# Patient Record
Sex: Female | Born: 1985 | Hispanic: No | Marital: Married | Smoking: Never smoker
Health system: Southern US, Community
[De-identification: ages and names within clinical notes are randomized; demographics above are authoritative.]

## PROBLEM LIST (undated history)

## (undated) DIAGNOSIS — J40 Bronchitis, not specified as acute or chronic: Secondary | ICD-10-CM

## (undated) DIAGNOSIS — R519 Headache, unspecified: Secondary | ICD-10-CM

## (undated) DIAGNOSIS — D561 Beta thalassemia: Secondary | ICD-10-CM

## (undated) DIAGNOSIS — K760 Fatty (change of) liver, not elsewhere classified: Secondary | ICD-10-CM

## (undated) DIAGNOSIS — R51 Headache: Secondary | ICD-10-CM

## (undated) DIAGNOSIS — Z789 Other specified health status: Secondary | ICD-10-CM

## (undated) DIAGNOSIS — I10 Essential (primary) hypertension: Secondary | ICD-10-CM

## (undated) HISTORY — PX: NO PAST SURGERIES: SHX2092

## (undated) HISTORY — DX: Fatty (change of) liver, not elsewhere classified: K76.0

## (undated) HISTORY — DX: Beta thalassemia: D56.1

## (undated) HISTORY — PX: OTHER SURGICAL HISTORY: SHX169

---

## 2010-03-01 ENCOUNTER — Inpatient Hospital Stay (HOSPITAL_COMMUNITY): Admission: AD | Admit: 2010-03-01 | Discharge: 2010-03-04 | Payer: Self-pay | Admitting: Obstetrics & Gynecology

## 2010-04-12 ENCOUNTER — Encounter: Admission: RE | Admit: 2010-04-12 | Discharge: 2010-04-12 | Payer: Self-pay | Admitting: Internal Medicine

## 2011-03-03 LAB — ABO/RH: RH Type: POSITIVE

## 2011-03-13 ENCOUNTER — Emergency Department (HOSPITAL_COMMUNITY)
Admission: EM | Admit: 2011-03-13 | Discharge: 2011-03-14 | Disposition: A | Discharge status: Home / Self Care | Payer: BC Managed Care – PPO | Attending: Emergency Medicine | Admitting: Emergency Medicine

## 2011-03-13 DIAGNOSIS — W268XXA Contact with other sharp object(s), not elsewhere classified, initial encounter: Secondary | ICD-10-CM | POA: Insufficient documentation

## 2011-03-13 DIAGNOSIS — Y93G1 Activity, food preparation and clean up: Secondary | ICD-10-CM | POA: Insufficient documentation

## 2011-03-13 DIAGNOSIS — Y92009 Unspecified place in unspecified non-institutional (private) residence as the place of occurrence of the external cause: Secondary | ICD-10-CM | POA: Insufficient documentation

## 2011-03-13 DIAGNOSIS — S51809A Unspecified open wound of unspecified forearm, initial encounter: Secondary | ICD-10-CM | POA: Insufficient documentation

## 2011-03-21 LAB — CBC
HCT: 18.4 % — ABNORMAL LOW (ref 36.0–46.0)
HCT: 19 % — ABNORMAL LOW (ref 36.0–46.0)
HCT: 30.2 % — ABNORMAL LOW (ref 36.0–46.0)
HCT: 37 % (ref 36.0–46.0)
Hemoglobin: 11.6 g/dL — ABNORMAL LOW (ref 12.0–15.0)
Hemoglobin: 5.7 g/dL — CL (ref 12.0–15.0)
Hemoglobin: 5.9 g/dL — CL (ref 12.0–15.0)
Hemoglobin: 7 g/dL — ABNORMAL LOW (ref 12.0–15.0)
Hemoglobin: 9.6 g/dL — ABNORMAL LOW (ref 12.0–15.0)
MCHC: 31 g/dL (ref 30.0–36.0)
MCHC: 31.4 g/dL (ref 30.0–36.0)
MCV: 69.6 fL — ABNORMAL LOW (ref 78.0–100.0)
MCV: 69.7 fL — ABNORMAL LOW (ref 78.0–100.0)
MCV: 71.7 fL — ABNORMAL LOW (ref 78.0–100.0)
MCV: 72.9 fL — ABNORMAL LOW (ref 78.0–100.0)
Platelets: 130 10*3/uL — ABNORMAL LOW (ref 150–400)
RDW: 15.3 % (ref 11.5–15.5)
RDW: 16.3 % — ABNORMAL HIGH (ref 11.5–15.5)
WBC: 10.5 10*3/uL (ref 4.0–10.5)
WBC: 12 10*3/uL — ABNORMAL HIGH (ref 4.0–10.5)
WBC: 12.8 10*3/uL — ABNORMAL HIGH (ref 4.0–10.5)

## 2011-03-21 LAB — RPR: RPR Ser Ql: NONREACTIVE

## 2011-03-21 LAB — CROSSMATCH: Antibody Screen: NEGATIVE

## 2011-03-29 ENCOUNTER — Inpatient Hospital Stay (INDEPENDENT_AMBULATORY_CARE_PROVIDER_SITE_OTHER)
Admission: RE | Admit: 2011-03-29 | Discharge: 2011-03-29 | Disposition: A | Discharge status: Home / Self Care | Payer: BC Managed Care – PPO | Source: Ambulatory Visit | Attending: Family Medicine | Admitting: Family Medicine

## 2011-03-29 DIAGNOSIS — Z4802 Encounter for removal of sutures: Secondary | ICD-10-CM

## 2011-08-11 LAB — STREP B DNA PROBE: GBS: NEGATIVE

## 2011-08-12 ENCOUNTER — Inpatient Hospital Stay (HOSPITAL_COMMUNITY)
Admission: AD | Admit: 2011-08-12 | Discharge: 2011-08-12 | Disposition: A | Discharge status: Home / Self Care | Payer: BC Managed Care – PPO | Source: Ambulatory Visit | Attending: Obstetrics and Gynecology | Admitting: Obstetrics and Gynecology

## 2011-08-12 ENCOUNTER — Encounter (HOSPITAL_COMMUNITY): Payer: Self-pay

## 2011-08-12 ENCOUNTER — Ambulatory Visit (HOSPITAL_COMMUNITY)
Admission: RE | Admit: 2011-08-12 | Discharge: 2011-08-12 | Disposition: A | Discharge status: Home / Self Care | Payer: BC Managed Care – PPO | Source: Ambulatory Visit | Attending: Obstetrics and Gynecology | Admitting: Obstetrics and Gynecology

## 2011-08-12 ENCOUNTER — Other Ambulatory Visit (HOSPITAL_COMMUNITY): Payer: Self-pay | Admitting: Obstetrics and Gynecology

## 2011-08-12 DIAGNOSIS — O368190 Decreased fetal movements, unspecified trimester, not applicable or unspecified: Secondary | ICD-10-CM

## 2011-08-12 DIAGNOSIS — Z3689 Encounter for other specified antenatal screening: Secondary | ICD-10-CM | POA: Insufficient documentation

## 2011-08-12 NOTE — ED Provider Notes (Addendum)
History   Pt presents today for NST secondary to decreased fetal movement. She had a BPP earlier with a score of 6/8. She denies any other problems at this time.  Chief Complaint  Patient presents with  . Decreased Fetal Movement   HPI  OB History    Grav Para Term Preterm Abortions TAB SAB Ect Mult Living   2 1 1       1       No past medical history on file.  No past surgical history on file.  No family history on file.  History  Substance Use Topics  . Smoking status: Not on file  . Smokeless tobacco: Not on file  . Alcohol Use: Not on file    Allergies: No Known Allergies  Prescriptions prior to admission  Medication Sig Dispense Refill  . Iron Combinations (IRON COMPLEX PO) Take 1 tablet by mouth daily.        . prenatal vitamin w/FE, FA (PRENATAL 1 + 1) 27-1 MG TABS Take 1 tablet by mouth daily.          Review of Systems  Constitutional: Negative for fever and chills.  Gastrointestinal: Negative for nausea, vomiting, abdominal pain, diarrhea and constipation.  Genitourinary: Negative for dysuria, urgency, frequency and hematuria.  Neurological: Negative for dizziness and headaches.  Psychiatric/Behavioral: Negative for depression and suicidal ideas.   Physical Exam   Blood pressure 102/57, pulse 111, temperature 98.4 F (36.9 C), temperature source Oral, resp. rate 18.  Physical Exam  Constitutional: She is oriented to person, place, and time. She appears well-developed and well-nourished. No distress.  HENT:  Head: Normocephalic and atraumatic.  Eyes: EOM are normal. Pupils are equal, round, and reactive to light.  GI: Soft. She exhibits no distension. There is no tenderness. There is no rebound and no guarding.  Genitourinary:       Cervix unchanged from earlier exam today in office. 1-2/thick/posterior.  Neurological: She is alert and oriented to person, place, and time.  Skin: Skin is warm and dry. She is not diaphoretic.  Psychiatric: She has a  normal mood and affect. Her behavior is normal. Judgment and thought content normal.    MAU Course  Procedures  NST reactive for a total BPP of 8/10.  Discussed pt with Dr. Henderson Cloud at length.  Assessment and Plan  Decreased fetal movement: pt has f/u scheduled. Discussed diet, activity, risks, and precautions.   Clinton Gallant. Nohealani Medinger III, DrHSc, MPAS, PA-C  08/12/2011, 7:36 PM

## 2011-08-12 NOTE — Progress Notes (Signed)
Patient was sent for BPP for decreased fetal movement from MD office, BPP 6/8, received order for NST.

## 2011-08-13 ENCOUNTER — Inpatient Hospital Stay (HOSPITAL_COMMUNITY)
Admission: AD | Admit: 2011-08-13 | Discharge: 2011-08-13 | Disposition: A | Discharge status: Home / Self Care | Payer: BC Managed Care – PPO | Source: Ambulatory Visit | Attending: Obstetrics and Gynecology | Admitting: Obstetrics and Gynecology

## 2011-08-13 ENCOUNTER — Encounter (HOSPITAL_COMMUNITY): Payer: Self-pay | Admitting: *Deleted

## 2011-08-13 DIAGNOSIS — O479 False labor, unspecified: Secondary | ICD-10-CM | POA: Insufficient documentation

## 2011-08-13 HISTORY — DX: Other specified health status: Z78.9

## 2011-08-13 NOTE — Progress Notes (Signed)
Contractions every 2 to 3 minutes for past two hours.

## 2011-08-13 NOTE — Progress Notes (Signed)
Pt states, " My contractions are now every 3 min, and they are stronger. I was 2.5 cm yesterday."

## 2011-08-13 NOTE — ED Notes (Signed)
Pl;an of care discussed with pt and spouse. Ambien offered, pt deferred and states, no problems with sleep.

## 2011-08-20 ENCOUNTER — Encounter (HOSPITAL_COMMUNITY): Payer: Self-pay | Admitting: Anesthesiology

## 2011-08-20 ENCOUNTER — Inpatient Hospital Stay (HOSPITAL_COMMUNITY)
Admission: AD | Admit: 2011-08-20 | Discharge: 2011-08-22 | DRG: 373 | Disposition: A | Discharge status: Home / Self Care | Payer: BC Managed Care – PPO | Source: Ambulatory Visit | Attending: Obstetrics & Gynecology | Admitting: Obstetrics & Gynecology

## 2011-08-20 ENCOUNTER — Inpatient Hospital Stay (HOSPITAL_COMMUNITY): Payer: BC Managed Care – PPO | Admitting: Anesthesiology

## 2011-08-20 ENCOUNTER — Encounter (HOSPITAL_COMMUNITY): Payer: Self-pay | Admitting: *Deleted

## 2011-08-20 LAB — CBC
Hemoglobin: 9.7 g/dL — ABNORMAL LOW (ref 12.0–15.0)
MCHC: 32 g/dL (ref 30.0–36.0)
RDW: 15.8 % — ABNORMAL HIGH (ref 11.5–15.5)

## 2011-08-20 MED ORDER — PRENATAL PLUS 27-1 MG PO TABS
1.0000 | ORAL_TABLET | Freq: Every day | ORAL | Status: DC
  Administered 2011-08-21 – 2011-08-22 (×2): 1 via ORAL
  Filled 2011-08-20 (×2): qty 1

## 2011-08-20 MED ORDER — LANOLIN HYDROUS EX OINT: TOPICAL_OINTMENT | CUTANEOUS | Status: DC | PRN

## 2011-08-20 MED ORDER — LACTATED RINGERS IV SOLN
500.0000 mL | Freq: Once | INTRAVENOUS | Status: AC
  Administered 2011-08-20: 500 mL via INTRAVENOUS

## 2011-08-20 MED ORDER — PHENYLEPHRINE 40 MCG/ML (10ML) SYRINGE FOR IV PUSH (FOR BLOOD PRESSURE SUPPORT)
80.0000 ug | PREFILLED_SYRINGE | INTRAVENOUS | Status: DC | PRN
  Filled 2011-08-20: qty 5

## 2011-08-20 MED ORDER — IBUPROFEN 600 MG PO TABS
600.0000 mg | ORAL_TABLET | Freq: Four times a day (QID) | ORAL | Status: DC | PRN
  Administered 2011-08-20: 600 mg via ORAL
  Filled 2011-08-20 (×2): qty 1

## 2011-08-20 MED ORDER — FLEET ENEMA 7-19 GM/118ML RE ENEM: 1.0000 | ENEMA | RECTAL | Status: DC | PRN

## 2011-08-20 MED ORDER — EPHEDRINE 5 MG/ML INJ
10.0000 mg | INTRAVENOUS | Status: DC | PRN
  Filled 2011-08-20 (×2): qty 4

## 2011-08-20 MED ORDER — LIDOCAINE HCL 1.5 % IJ SOLN
INTRAMUSCULAR | Status: DC | PRN
  Administered 2011-08-20: 2 mL via EPIDURAL
  Administered 2011-08-20 (×2): 5 mL via EPIDURAL

## 2011-08-20 MED ORDER — PHENYLEPHRINE 40 MCG/ML (10ML) SYRINGE FOR IV PUSH (FOR BLOOD PRESSURE SUPPORT)
80.0000 ug | PREFILLED_SYRINGE | INTRAVENOUS | Status: DC | PRN
  Filled 2011-08-20 (×2): qty 5

## 2011-08-20 MED ORDER — OXYCODONE-ACETAMINOPHEN 5-325 MG PO TABS: 1.0000 | ORAL_TABLET | ORAL | Status: DC | PRN

## 2011-08-20 MED ORDER — LIDOCAINE HCL (PF) 1 % IJ SOLN
30.0000 mL | INTRAMUSCULAR | Status: DC | PRN
  Filled 2011-08-20 (×2): qty 30

## 2011-08-20 MED ORDER — FENTANYL 2.5 MCG/ML BUPIVACAINE 1/10 % EPIDURAL INFUSION (WH - ANES)
14.0000 mL/h | INTRAMUSCULAR | Status: DC
  Administered 2011-08-20 (×2): 14 mL/h via EPIDURAL
  Filled 2011-08-20 (×2): qty 60

## 2011-08-20 MED ORDER — LACTATED RINGERS IV SOLN
500.0000 mL | INTRAVENOUS | Status: DC | PRN
  Administered 2011-08-20: 1000 mL via INTRAVENOUS

## 2011-08-20 MED ORDER — OXYTOCIN BOLUS FROM INFUSION
500.0000 mL | Freq: Once | INTRAVENOUS | Status: DC
  Filled 2011-08-20: qty 500

## 2011-08-20 MED ORDER — OXYTOCIN 20 UNITS IN LACTATED RINGERS INFUSION - SIMPLE: 125.0000 mL/h | INTRAVENOUS | Status: AC

## 2011-08-20 MED ORDER — BENZOCAINE-MENTHOL 20-0.5 % EX AERO
1.0000 "application " | INHALATION_SPRAY | CUTANEOUS | Status: DC | PRN
  Administered 2011-08-21: 1 via TOPICAL

## 2011-08-20 MED ORDER — ONDANSETRON HCL 4 MG PO TABS: 4.0000 mg | ORAL_TABLET | ORAL | Status: DC | PRN

## 2011-08-20 MED ORDER — WITCH HAZEL-GLYCERIN EX PADS: 1.0000 "application " | MEDICATED_PAD | CUTANEOUS | Status: DC | PRN

## 2011-08-20 MED ORDER — DIPHENHYDRAMINE HCL 50 MG/ML IJ SOLN: 12.5000 mg | INTRAMUSCULAR | Status: DC | PRN

## 2011-08-20 MED ORDER — IBUPROFEN 600 MG PO TABS
600.0000 mg | ORAL_TABLET | Freq: Four times a day (QID) | ORAL | Status: DC
  Administered 2011-08-21 – 2011-08-22 (×5): 600 mg via ORAL
  Filled 2011-08-20 (×4): qty 1

## 2011-08-20 MED ORDER — SIMETHICONE 80 MG PO CHEW: 80.0000 mg | CHEWABLE_TABLET | ORAL | Status: DC | PRN

## 2011-08-20 MED ORDER — BUTORPHANOL TARTRATE 2 MG/ML IJ SOLN: 1.0000 mg | INTRAMUSCULAR | Status: DC | PRN

## 2011-08-20 MED ORDER — SENNOSIDES-DOCUSATE SODIUM 8.6-50 MG PO TABS
2.0000 | ORAL_TABLET | Freq: Every day | ORAL | Status: DC
  Administered 2011-08-21: 2 via ORAL

## 2011-08-20 MED ORDER — CITRIC ACID-SODIUM CITRATE 334-500 MG/5ML PO SOLN: 30.0000 mL | ORAL | Status: DC | PRN

## 2011-08-20 MED ORDER — ONDANSETRON HCL 4 MG/2ML IJ SOLN: 4.0000 mg | Freq: Four times a day (QID) | INTRAMUSCULAR | Status: DC | PRN

## 2011-08-20 MED ORDER — TERBUTALINE SULFATE 1 MG/ML IJ SOLN: 0.2500 mg | Freq: Once | INTRAMUSCULAR | Status: DC | PRN

## 2011-08-20 MED ORDER — OXYTOCIN 20 UNITS IN LACTATED RINGERS INFUSION - SIMPLE
1.0000 m[IU]/min | INTRAVENOUS | Status: DC
  Administered 2011-08-20: 1 m[IU]/min via INTRAVENOUS
  Filled 2011-08-20: qty 1000

## 2011-08-20 MED ORDER — ACETAMINOPHEN 325 MG PO TABS: 650.0000 mg | ORAL_TABLET | ORAL | Status: DC | PRN

## 2011-08-20 MED ORDER — TETANUS-DIPHTH-ACELL PERTUSSIS 5-2.5-18.5 LF-MCG/0.5 IM SUSP: 0.5000 mL | Freq: Once | INTRAMUSCULAR | Status: DC

## 2011-08-20 MED ORDER — OXYCODONE-ACETAMINOPHEN 5-325 MG PO TABS: 2.0000 | ORAL_TABLET | ORAL | Status: DC | PRN

## 2011-08-20 MED ORDER — DIBUCAINE 1 % RE OINT: 1.0000 "application " | TOPICAL_OINTMENT | RECTAL | Status: DC | PRN

## 2011-08-20 MED ORDER — ZOLPIDEM TARTRATE 5 MG PO TABS: 5.0000 mg | ORAL_TABLET | Freq: Every evening | ORAL | Status: DC | PRN

## 2011-08-20 MED ORDER — METHYLERGONOVINE MALEATE 0.2 MG/ML IJ SOLN
INTRAMUSCULAR | Status: AC
  Administered 2011-08-20: 0.2 mg via INTRAMUSCULAR
  Filled 2011-08-20: qty 1

## 2011-08-20 MED ORDER — ONDANSETRON HCL 4 MG/2ML IJ SOLN: 4.0000 mg | INTRAMUSCULAR | Status: DC | PRN

## 2011-08-20 MED ORDER — EPHEDRINE 5 MG/ML INJ
10.0000 mg | INTRAVENOUS | Status: DC | PRN
  Filled 2011-08-20: qty 4

## 2011-08-20 MED ORDER — DIPHENHYDRAMINE HCL 25 MG PO CAPS: 25.0000 mg | ORAL_CAPSULE | Freq: Four times a day (QID) | ORAL | Status: DC | PRN

## 2011-08-20 MED ORDER — LACTATED RINGERS IV SOLN
INTRAVENOUS | Status: DC
  Administered 2011-08-20 (×2): via INTRAVENOUS

## 2011-08-20 NOTE — Progress Notes (Signed)
1850 dr. Aldona Bar notified that the pt is 8cm, -1,0 station and feeling some pressure.

## 2011-08-20 NOTE — Progress Notes (Signed)
corrction to previous note. Dr. Aldona Bar notified at 1855 that pt is 8cm, -1, 0 station.

## 2011-08-20 NOTE — Progress Notes (Signed)
Patient reports onset of watery discharge with blood mixed in, back pain

## 2011-08-20 NOTE — Anesthesia Preprocedure Evaluation (Signed)
Anesthesia Evaluation  Name, MR# and DOB Patient awake  General Assessment Comment  Reviewed: Allergy & Precautions, H&P , NPO status , Patient's Chart, lab work & pertinent test results, reviewed documented beta blocker date and time   History of Anesthesia Complications Negative for: history of anesthetic complications  Airway Mallampati: II TM Distance: >3 FB Neck ROM: full    Dental  (+) Teeth Intact   Pulmonary  clear to auscultation  breath sounds clear to auscultation none    Cardiovascular regular Normal    Neuro/Psych Negative Neurological ROS  Negative Psych ROS  GI/Hepatic/Renal negative GI ROS  negative Liver ROS  negative Renal ROS        Endo/Other  Negative Endocrine ROS (+)      Abdominal   Musculoskeletal   Hematology negative hematology ROS (+)   Peds  Reproductive/Obstetrics (+) Pregnancy    Anesthesia Other Findings             Anesthesia Physical Anesthesia Plan  ASA: II  Anesthesia Plan: Epidural   Post-op Pain Management:    Induction:   Airway Management Planned:   Additional Equipment:   Intra-op Plan:   Post-operative Plan:   Informed Consent: I have reviewed the patients History and Physical, chart, labs and discussed the procedure including the risks, benefits and alternatives for the proposed anesthesia with the patient or authorized representative who has indicated his/her understanding and acceptance.     Plan Discussed with:   Anesthesia Plan Comments:         Anesthesia Quick Evaluation  

## 2011-08-20 NOTE — Progress Notes (Signed)
Progressed well and had NSD of viable 8 pound 8 oz. Female, 9/9 Apgars over 2nd degree tear.  PDI--grossly normal, 2A/1V.  Tear repaired.  Breast feed.  Does desire circ.

## 2011-08-20 NOTE — Anesthesia Procedure Notes (Signed)
Epidural Patient location during procedure: OB Start time: 08/20/2011 2:39 PM Reason for block: procedure for pain  Staffing Performed by: anesthesiologist   Preanesthetic Checklist Completed: patient identified, site marked, surgical consent, pre-op evaluation, timeout performed, IV checked, risks and benefits discussed and monitors and equipment checked  Epidural Patient position: sitting Prep: site prepped and draped and DuraPrep Patient monitoring: continuous pulse ox and blood pressure Approach: midline Injection technique: LOR air  Needle:  Needle type: Tuohy  Needle gauge: 17 G Needle length: 9 cm Needle insertion depth: 5 cm cm Catheter type: closed end flexible Catheter size: 19 Gauge Catheter at skin depth: 10 cm Test dose: negative  Assessment Events: blood not aspirated, injection not painful, no injection resistance, negative IV test and no paresthesia

## 2011-08-20 NOTE — Progress Notes (Signed)
Cx slightly changed -- now 3-4.  AROM clear fluid.  Baby reactive.  IUPC placed and will increase rate of pitocin increase.

## 2011-08-20 NOTE — H&P (Addendum)
  G2P1 at 39+ weeks with mild contractions and increased show and ? Of ROM.Marland Kitchen Preg benign.  -GBS.  Seen by me yesterday in office and then was 2/60/vtx -2.  Concern about having another big baby.  !st preg delivered in 3/11 resulted in 9 pound baby with 3rd degree tears and a lot of blood loss.  VS stable.  FH reactive.  ? Some contractions.  EFW 8 pounds CX 3/60-70/vtx -2 and membranes felt.  Imp:  39+ weeks Probable early labor (cervical change since yesterday)  Plan:  Admit.  Discussed pitocin.  Expectant management.

## 2011-08-21 LAB — CBC
Hemoglobin: 8.4 g/dL — ABNORMAL LOW (ref 12.0–15.0)
Platelets: 183 10*3/uL (ref 150–400)
RBC: 4.26 MIL/uL (ref 3.87–5.11)
WBC: 9.3 10*3/uL (ref 4.0–10.5)

## 2011-08-21 MED ORDER — METHYLERGONOVINE MALEATE 0.2 MG/ML IJ SOLN
0.2000 mg | Freq: Once | INTRAMUSCULAR | Status: AC
  Administered 2011-08-21: 0.2 mg via INTRAMUSCULAR
  Filled 2011-08-21: qty 1

## 2011-08-21 MED ORDER — BENZOCAINE-MENTHOL 20-0.5 % EX AERO
INHALATION_SPRAY | CUTANEOUS | Status: AC
  Administered 2011-08-21: 1 via TOPICAL
  Filled 2011-08-21: qty 56

## 2011-08-21 NOTE — Progress Notes (Signed)
Doing Great!  Breast.  CBC 8.4/9.3  Platelets 183K.  Hgb was 9.7 pre delivery.  Baby to be circ'ed today if OK'ed by Peds. Likely D/C AM 8/27.

## 2011-08-21 NOTE — Anesthesia Postprocedure Evaluation (Signed)
  Anesthesia Post-op Note  Patient: Danielle Simmons  Procedure(s) Performed: * No procedures listed *  Patient Location: PACU and Mother/Baby  Anesthesia Type: Epidural  Level of Consciousness: awake, alert  and oriented  Airway and Oxygen Therapy: Patient Spontanous Breathing  Post-op Pain: none  Post-op Assessment: Patient's Cardiovascular Status Stable, Respiratory Function Stable, Patent Airway, No signs of Nausea or vomiting and Pain level controlled  Post-op Vital Signs: stable  Complications: No apparent anesthesia complications

## 2011-08-22 NOTE — Progress Notes (Signed)
  Post Partum Day 2 Subjective: no complaints  Objective: Blood pressure 90/59, pulse 86, temperature 98.2 F (36.8 C), temperature source Oral, resp. rate 18, height 5\' 2"  (1.575 m), weight 81.557 kg (179 lb 12.8 oz), SpO2 99.00%, unknown if currently breastfeeding.  Physical Exam:  General: alert Lochia: appropriate Uterine Fundus: firm   Basename 08/21/11 0534 08/20/11 1345  HGB 8.4* 9.7*  HCT 26.2* 30.3*    Assessment/Plan: Discharge home and Circumcision prior to discharge   LOS: 2 days   Hien Perreira D 08/22/2011, 9:03 AM

## 2011-08-22 NOTE — Progress Notes (Signed)
UR chart review completed.  

## 2011-08-22 NOTE — Discharge Summary (Signed)
Obstetric Discharge Summary Reason for Admission: onset of labor Prenatal Procedures: ultrasound Intrapartum Procedures: spontaneous vaginal delivery Postpartum Procedures: none Complications-Operative and Postpartum: 2nd degree perineal laceration Hemoglobin  Date Value Range Status  08/21/2011 8.4* 12.0-15.0 (g/dL) Final     HCT  Date Value Range Status  08/21/2011 26.2* 36.0-46.0 (%) Final    Discharge Diagnoses: Term Pregnancy-delivered  Discharge Information: Date: 08/22/2011 Activity: pelvic rest Diet: routine Medications: PNV and Ibuprophen Condition: stable Instructions: refer to practice specific booklet Discharge to: home Follow-up Information    Follow up with WEIN,ROBERT M in 5 weeks.   Contact information:   [address] [place] [address] [phone]          Newborn Data: Live born female  Birth Weight: 8 lb 8.2 oz (3860 g) APGAR: 9, 9  Home with mother.  Malic Rosten D 08/22/2011, 9:04 AM

## 2011-08-22 NOTE — Progress Notes (Signed)
SW consult received for "babies who have drug screen sent." SW reviewed babies chart and there has not been any drug screens ordered.  Therefore, SW has screened out this referral as an error.  SW will only see if appropriate consult is ordered. 

## 2013-05-27 ENCOUNTER — Emergency Department (HOSPITAL_COMMUNITY): Payer: BC Managed Care – PPO

## 2013-05-27 ENCOUNTER — Emergency Department (HOSPITAL_COMMUNITY)
Admission: EM | Admit: 2013-05-27 | Discharge: 2013-05-28 | Disposition: A | Discharge status: Home / Self Care | Payer: BC Managed Care – PPO | Attending: Emergency Medicine | Admitting: Emergency Medicine

## 2013-05-27 ENCOUNTER — Encounter (HOSPITAL_COMMUNITY): Payer: Self-pay | Admitting: Emergency Medicine

## 2013-05-27 DIAGNOSIS — S8392XA Sprain of unspecified site of left knee, initial encounter: Secondary | ICD-10-CM

## 2013-05-27 DIAGNOSIS — Y92410 Unspecified street and highway as the place of occurrence of the external cause: Secondary | ICD-10-CM | POA: Insufficient documentation

## 2013-05-27 DIAGNOSIS — Y9389 Activity, other specified: Secondary | ICD-10-CM | POA: Insufficient documentation

## 2013-05-27 DIAGNOSIS — IMO0002 Reserved for concepts with insufficient information to code with codable children: Secondary | ICD-10-CM | POA: Insufficient documentation

## 2013-05-27 DIAGNOSIS — Z79899 Other long term (current) drug therapy: Secondary | ICD-10-CM | POA: Insufficient documentation
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
## 2013-05-28 NOTE — ED Provider Notes (Signed)
Medical screening examination/treatment/procedure(s) were performed by non-physician practitioner and as supervising physician I was immediately available for consultation/collaboration. Chris Narasimhan, MD, FACEP   Stefano Trulson L Haleemah Buckalew, MD 05/28/13 1237 

## 2013-11-15 ENCOUNTER — Encounter (HOSPITAL_COMMUNITY): Payer: Self-pay | Admitting: Emergency Medicine

## 2013-11-15 DIAGNOSIS — Z79899 Other long term (current) drug therapy: Secondary | ICD-10-CM | POA: Insufficient documentation

## 2013-11-15 DIAGNOSIS — J209 Acute bronchitis, unspecified: Secondary | ICD-10-CM | POA: Insufficient documentation

## 2013-11-15 NOTE — ED Notes (Addendum)
Pt. reports persistent productive cough , nasal congestion and SOB when lying on bed for 2 weeks , seen at an urgent care diagnosed with bronchitis and prescribed with MDI and currently taking  oral antibiotic with no improvement.

## 2013-11-16 ENCOUNTER — Emergency Department (HOSPITAL_COMMUNITY)
Admission: EM | Admit: 2013-11-16 | Discharge: 2013-11-16 | Disposition: A | Discharge status: Home / Self Care | Payer: BC Managed Care – PPO | Attending: Emergency Medicine | Admitting: Emergency Medicine

## 2013-11-16 ENCOUNTER — Emergency Department (HOSPITAL_COMMUNITY): Payer: BC Managed Care – PPO

## 2013-11-16 DIAGNOSIS — J209 Acute bronchitis, unspecified: Secondary | ICD-10-CM

## 2013-11-16 HISTORY — DX: Bronchitis, not specified as acute or chronic: J40

## 2013-11-16 LAB — CBC
HCT: 35.3 % — ABNORMAL LOW (ref 36.0–46.0)
Hemoglobin: 11.8 g/dL — ABNORMAL LOW (ref 12.0–15.0)
MCHC: 33.4 g/dL (ref 30.0–36.0)
MCV: 61.3 fL — ABNORMAL LOW (ref 78.0–100.0)
WBC: 11.2 10*3/uL — ABNORMAL HIGH (ref 4.0–10.5)

## 2013-11-16 MED ORDER — ALBUTEROL SULFATE (5 MG/ML) 0.5% IN NEBU
5.0000 mg | INHALATION_SOLUTION | Freq: Once | RESPIRATORY_TRACT | Status: AC
  Administered 2013-11-16: 5 mg via RESPIRATORY_TRACT
  Filled 2013-11-16: qty 1

## 2013-11-16 MED ORDER — AEROCHAMBER PLUS W/MASK MISC
1.0000 | Freq: Once | Status: AC
  Administered 2013-11-16: 1
  Filled 2013-11-16: qty 1

## 2013-11-16 NOTE — ED Notes (Signed)
Dr. J in room. 

## 2013-11-16 NOTE — ED Provider Notes (Addendum)
CSN: [id number]     Arrival date & time 11/15/13  2317 History   None    Chief Complaint  Patient presents with  . Cough   (Consider location/radiation/quality/duration/timing/severity/associated sxs/prior Treatment) Patient is a 27 y.o. female presenting with cough.  Cough Associated symptoms: shortness of breath    Complains of cough productive of green sputum for approximately 2 weeks. Nothing makes symptoms better or worse. She's been treated with a Z-Pak and with albuterol inhaler, without relief. No fever. No other associated symptoms.  Past Medical History  Diagnosis Date  . No pertinent past medical history   . Bronchitis    anemia Past Surgical History  Procedure Laterality Date  . No past surgeries     No family history on file. History  Substance Use Topics  . Smoking status: Never Smoker   . Smokeless tobacco: Never Used  . Alcohol Use: No   OB History   Grav Para Term Preterm Abortions TAB SAB Ect Mult Living   2 2 2       2      Review of Systems  Constitutional: Negative.   HENT: Negative.   Respiratory: Positive for cough and shortness of breath.   Cardiovascular: Negative.   Gastrointestinal: Negative.   Musculoskeletal: Negative.   Skin: Negative.   Neurological: Negative.   Psychiatric/Behavioral: Negative.   All other systems reviewed and are negative.    Allergies  Review of patient's allergies indicates no known allergies.  Home Medications   Current Outpatient Rx  Name  Route  Sig  Dispense  Refill  . Iron Combinations (IRON COMPLEX PO)   Oral   Take 1 tablet by mouth daily.           . phentermine 37.5 MG capsule   Oral   Take 37.5 mg by mouth every morning.          BP 136/85  Pulse 91  Temp(Src) 98 F (36.7 C) (Oral)  Resp 18  Wt 174 lb 11.2 oz (79.243 kg)  SpO2 95%  LMP 10/28/2013 Physical Exam  Nursing note and vitals reviewed. Constitutional: She appears well-developed and well-nourished. No distress.  HENT:   Head: Normocephalic and atraumatic.  Eyes: Conjunctivae are normal. Pupils are equal, round, and reactive to light.  Neck: Neck supple. No tracheal deviation present. No thyromegaly present.  Cardiovascular: Normal rate and regular rhythm.   No murmur heard. Pulmonary/Chest: Effort normal. No respiratory distress. She has wheezes.  End expiratory wheezes. No respiratory distress speaks in paragraphs  Abdominal: Soft. Bowel sounds are normal. She exhibits no distension. There is no tenderness.  Musculoskeletal: Normal range of motion. She exhibits no edema and no tenderness.  Neurological: She is alert. Coordination normal.  Skin: Skin is warm and dry. No rash noted.  Psychiatric: She has a normal mood and affect.    ED Course  Procedures (including critical care time) Labs Review Labs Reviewed - No data to display Imaging Review No results found.  EKG Interpretation   None     chest xray viewed by me Results for orders placed during the hospital encounter of 11/16/13  CBC      Result Value Range   WBC PENDING  4.0 - 10.5 K/uL   RBC 5.76 (*) 3.87 - 5.11 MIL/uL   Hemoglobin 11.8 (*) 12.0 - 15.0 g/dL   HCT 46.9 (*) 62.9 - 52.8 %   MCV 61.3 (*) 78.0 - 100.0 fL   MCH 20.5 (*) 26.0 - 34.0  pg   MCHC 33.4  30.0 - 36.0 g/dL   RDW 16.1 (*) 09.6 - 04.5 %   Platelets PENDING  150 - 400 K/uL   white cell count 7.2, platelet count 1 73,000 discussed with lab technician Dg Chest 2 View  11/16/2013   CLINICAL DATA:  Persistent cough with wheeze  EXAM: CHEST  2 VIEW  COMPARISON:  04/12/2010  FINDINGS: The heart size and mediastinal contours are within normal limits. Both lungs are clear. The visualized skeletal structures are unremarkable.  IMPRESSION: No active cardiopulmonary disease.   Electronically Signed   By: Tiburcio Pea M.D.   On: 11/16/2013 01:27    2:30 AM Patient asymptomatic lungs clear auscultation after treatment with albuterol nebulizer MDM  No diagnosis found. No  need to continue antibiotic She will get a spacer to use with her albuterol inhaler she's only been using it 3 times per day. She is advised that she can use 2 puffs every 4 hours when necessary shortness of breath. She is referred to the wellness Center at the resource guide Diagnosis acute bronchitis     Doug Sou, MD 11/16/13 4098  Doug Sou, MD 11/16/13 [phone]

## 2013-12-26 NOTE — L&D Delivery Note (Signed)
Delivery Note Patient pushed for 5 minutes after she was noted to be complete/ complete / +2 At 3:34 AM a viable and healthy female was delivered via Vaginal, Spontaneous Delivery (Presentation: ; Occiput Anterior).  APGAR: 9, 9; weight pending Placenta delivered intact, spontaneously , .  Cord: 3 vessels  with the following complications: post partum hemorrhage.  Post delivery there was notable uterine atony not alleviated by massage and IV pitocin. A post partum hemorrhage was called and the Rapid Response team  Was notified. IM methergine x 1 dose was administered as well as 800 mcg of Cytotec per rectum.   There was a decrease in the amount of bleeding. Foley catheter was placed after repair of the laceration. Patient remained hemodynamically stable, alert awake and Oriented.  CBC to be drawn 1hr post partum to assess level of anemia as patient initially with a Hb of 9.    Anesthesia: Epidural  Episiotomy:  Lacerations: 2nd degree Suture Repair: 2.0 vicryl Est. Blood Loss (mL): 1500  Mom to postpartum.  Baby to Couplet care / Skin to Skin.  Essie HartINN, Ritchard Paragas STACIA 10/15/2014, 4:55 AM

## 2014-03-20 LAB — OB RESULTS CONSOLE GC/CHLAMYDIA
Chlamydia: NEGATIVE
Gonorrhea: NEGATIVE

## 2014-03-20 LAB — OB RESULTS CONSOLE RUBELLA ANTIBODY, IGM: Rubella: IMMUNE

## 2014-03-20 LAB — OB RESULTS CONSOLE ABO/RH: RH TYPE: POSITIVE

## 2014-03-20 LAB — OB RESULTS CONSOLE HIV ANTIBODY (ROUTINE TESTING): HIV: NONREACTIVE

## 2014-03-20 LAB — OB RESULTS CONSOLE ANTIBODY SCREEN: ANTIBODY SCREEN: NEGATIVE

## 2014-03-20 LAB — OB RESULTS CONSOLE HEPATITIS B SURFACE ANTIGEN: HEP B S AG: NEGATIVE

## 2014-03-20 LAB — OB RESULTS CONSOLE RPR: RPR: NONREACTIVE

## 2014-09-26 LAB — OB RESULTS CONSOLE GBS: STREP GROUP B AG: NEGATIVE

## 2014-10-09 ENCOUNTER — Encounter (HOSPITAL_COMMUNITY): Payer: Self-pay | Admitting: *Deleted

## 2014-10-09 ENCOUNTER — Telehealth (HOSPITAL_COMMUNITY): Payer: Self-pay | Admitting: *Deleted

## 2014-10-09 NOTE — Telephone Encounter (Signed)
Preadmission screen  

## 2014-10-14 ENCOUNTER — Inpatient Hospital Stay (HOSPITAL_COMMUNITY)
Admission: RE | Admit: 2014-10-14 | Discharge: 2014-10-16 | DRG: 774 | Disposition: A | Discharge status: Home / Self Care | Payer: BC Managed Care – PPO | Source: Ambulatory Visit | Attending: Obstetrics & Gynecology | Admitting: Obstetrics & Gynecology

## 2014-10-14 ENCOUNTER — Encounter (HOSPITAL_COMMUNITY): Payer: BC Managed Care – PPO | Admitting: Anesthesiology

## 2014-10-14 ENCOUNTER — Encounter (HOSPITAL_COMMUNITY): Payer: Self-pay

## 2014-10-14 ENCOUNTER — Inpatient Hospital Stay (HOSPITAL_COMMUNITY): Payer: BC Managed Care – PPO | Admitting: Anesthesiology

## 2014-10-14 VITALS — BP 118/78 | HR 72 | Temp 98.0°F | Resp 20 | Ht 61.0 in | Wt 196.0 lb

## 2014-10-14 DIAGNOSIS — D696 Thrombocytopenia, unspecified: Secondary | ICD-10-CM | POA: Diagnosis present

## 2014-10-14 DIAGNOSIS — Z3A39 39 weeks gestation of pregnancy: Secondary | ICD-10-CM | POA: Diagnosis present

## 2014-10-14 DIAGNOSIS — IMO0001 Reserved for inherently not codable concepts without codable children: Secondary | ICD-10-CM

## 2014-10-14 DIAGNOSIS — O9912 Other diseases of the blood and blood-forming organs and certain disorders involving the immune mechanism complicating childbirth: Secondary | ICD-10-CM | POA: Diagnosis present

## 2014-10-14 DIAGNOSIS — Z8249 Family history of ischemic heart disease and other diseases of the circulatory system: Secondary | ICD-10-CM | POA: Diagnosis not present

## 2014-10-14 DIAGNOSIS — O3663X0 Maternal care for excessive fetal growth, third trimester, not applicable or unspecified: Secondary | ICD-10-CM | POA: Diagnosis present

## 2014-10-14 HISTORY — DX: Headache, unspecified: R51.9

## 2014-10-14 HISTORY — DX: Headache: R51

## 2014-10-14 LAB — CBC
HCT: 31.3 % — ABNORMAL LOW (ref 36.0–46.0)
HCT: 30.1 % — ABNORMAL LOW (ref 36.0–46.0)
HEMOGLOBIN: 9.6 g/dL — AB (ref 12.0–15.0)
Hemoglobin: 9.9 g/dL — ABNORMAL LOW (ref 12.0–15.0)
MCH: 18.8 pg — ABNORMAL LOW (ref 26.0–34.0)
MCH: 19 pg — ABNORMAL LOW (ref 26.0–34.0)
MCHC: 31.6 g/dL (ref 30.0–36.0)
MCHC: 31.9 g/dL (ref 30.0–36.0)
MCV: 59.5 fL — AB (ref 78.0–100.0)
MCV: 59.5 fL — ABNORMAL LOW (ref 78.0–100.0)
Platelets: 122 10*3/uL — ABNORMAL LOW (ref 150–400)
Platelets: 127 10*3/uL — ABNORMAL LOW (ref 150–400)
RBC: 5.26 MIL/uL — AB (ref 3.87–5.11)
RBC: 5.06 MIL/uL (ref 3.87–5.11)
RDW: 20.1 % — AB (ref 11.5–15.5)
RDW: 20 % — ABNORMAL HIGH (ref 11.5–15.5)
WBC: 6.8 10*3/uL (ref 4.0–10.5)
WBC: 8.4 10*3/uL (ref 4.0–10.5)

## 2014-10-14 LAB — RPR

## 2014-10-14 MED ORDER — LACTATED RINGERS IV SOLN: 500.0000 mL | INTRAVENOUS | Status: DC | PRN

## 2014-10-14 MED ORDER — LACTATED RINGERS IV SOLN
500.0000 mL | Freq: Once | INTRAVENOUS | Status: AC
  Administered 2014-10-14: 500 mL via INTRAVENOUS

## 2014-10-14 MED ORDER — NALBUPHINE HCL 10 MG/ML IJ SOLN: 5.0000 mg | INTRAMUSCULAR | Status: DC | PRN

## 2014-10-14 MED ORDER — CITRIC ACID-SODIUM CITRATE 334-500 MG/5ML PO SOLN: 30.0000 mL | ORAL | Status: DC | PRN

## 2014-10-14 MED ORDER — MISOPROSTOL 25 MCG QUARTER TABLET
25.0000 ug | ORAL_TABLET | ORAL | Status: DC | PRN
  Administered 2014-10-14 (×2): 25 ug via VAGINAL
  Filled 2014-10-14 (×2): qty 0.25

## 2014-10-14 MED ORDER — EPHEDRINE 5 MG/ML INJ: 10.0000 mg | INTRAVENOUS | Status: DC | PRN

## 2014-10-14 MED ORDER — TERBUTALINE SULFATE 1 MG/ML IJ SOLN: 0.2500 mg | Freq: Once | INTRAMUSCULAR | Status: AC | PRN

## 2014-10-14 MED ORDER — FENTANYL 2.5 MCG/ML BUPIVACAINE 1/10 % EPIDURAL INFUSION (WH - ANES)
14.0000 mL/h | INTRAMUSCULAR | Status: DC | PRN
  Administered 2014-10-14: 14 mL/h via EPIDURAL
  Filled 2014-10-14: qty 125

## 2014-10-14 MED ORDER — LACTATED RINGERS IV SOLN
INTRAVENOUS | Status: DC
  Administered 2014-10-14 – 2014-10-15 (×4): via INTRAVENOUS

## 2014-10-14 MED ORDER — LIDOCAINE HCL (PF) 1 % IJ SOLN
30.0000 mL | INTRAMUSCULAR | Status: DC | PRN
  Filled 2014-10-14: qty 30

## 2014-10-14 MED ORDER — ACETAMINOPHEN 325 MG PO TABS: 650.0000 mg | ORAL_TABLET | ORAL | Status: DC | PRN

## 2014-10-14 MED ORDER — OXYCODONE-ACETAMINOPHEN 5-325 MG PO TABS: 2.0000 | ORAL_TABLET | ORAL | Status: DC | PRN

## 2014-10-14 MED ORDER — PHENYLEPHRINE 40 MCG/ML (10ML) SYRINGE FOR IV PUSH (FOR BLOOD PRESSURE SUPPORT)
80.0000 ug | PREFILLED_SYRINGE | INTRAVENOUS | Status: DC | PRN
  Filled 2014-10-14: qty 10

## 2014-10-14 MED ORDER — OXYTOCIN BOLUS FROM INFUSION: 500.0000 mL | INTRAVENOUS | Status: DC

## 2014-10-14 MED ORDER — DIPHENHYDRAMINE HCL 50 MG/ML IJ SOLN: 12.5000 mg | INTRAMUSCULAR | Status: DC | PRN

## 2014-10-14 MED ORDER — OXYTOCIN 40 UNITS IN LACTATED RINGERS INFUSION - SIMPLE MED
1.0000 m[IU]/min | INTRAVENOUS | Status: DC
  Administered 2014-10-14: 2 m[IU]/min via INTRAVENOUS

## 2014-10-14 MED ORDER — OXYCODONE-ACETAMINOPHEN 5-325 MG PO TABS: 1.0000 | ORAL_TABLET | ORAL | Status: DC | PRN

## 2014-10-14 MED ORDER — TERBUTALINE SULFATE 1 MG/ML IJ SOLN: 0.2500 mg | Freq: Once | INTRAMUSCULAR | Status: DC | PRN

## 2014-10-14 MED ORDER — ONDANSETRON HCL 4 MG/2ML IJ SOLN: 4.0000 mg | Freq: Four times a day (QID) | INTRAMUSCULAR | Status: DC | PRN

## 2014-10-14 MED ORDER — OXYTOCIN 40 UNITS IN LACTATED RINGERS INFUSION - SIMPLE MED
62.5000 mL/h | INTRAVENOUS | Status: DC
  Filled 2014-10-14 (×2): qty 1000

## 2014-10-14 MED ORDER — PHENYLEPHRINE 40 MCG/ML (10ML) SYRINGE FOR IV PUSH (FOR BLOOD PRESSURE SUPPORT): 80.0000 ug | PREFILLED_SYRINGE | INTRAVENOUS | Status: DC | PRN

## 2014-10-14 MED ORDER — LIDOCAINE HCL (PF) 1 % IJ SOLN
INTRAMUSCULAR | Status: DC | PRN
  Administered 2014-10-14 (×5): 4 mL

## 2014-10-14 NOTE — Anesthesia Preprocedure Evaluation (Signed)
Anesthesia Evaluation  Patient identified by MRN, date of birth, ID band Patient awake    Reviewed: Allergy & Precautions, H&P , NPO status , Patient's Chart, lab work & pertinent test results, reviewed documented beta blocker date and time   History of Anesthesia Complications Negative for: history of anesthetic complications  Airway Mallampati: I TM Distance: >3 FB Neck ROM: full    Dental  (+) Teeth Intact   Pulmonary  breath sounds clear to auscultation        Cardiovascular negative cardio ROS  Rhythm:regular Rate:Normal     Neuro/Psych negative neurological ROS  negative psych ROS   GI/Hepatic negative GI ROS, Neg liver ROS,   Endo/Other  negative endocrine ROSBMI 37.1  Renal/GU negative Renal ROS     Musculoskeletal   Abdominal   Peds  Hematology negative hematology ROS (+) anemia , Gestational thrombocytopenia - plt 127 Beta-thalassemia   Anesthesia Other Findings   Reproductive/Obstetrics (+) Pregnancy                           Anesthesia Physical Anesthesia Plan  ASA: II  Anesthesia Plan: Epidural   Post-op Pain Management:    Induction:   Airway Management Planned:   Additional Equipment:   Intra-op Plan:   Post-operative Plan:   Informed Consent: I have reviewed the patients History and Physical, chart, labs and discussed the procedure including the risks, benefits and alternatives for the proposed anesthesia with the patient or authorized representative who has indicated his/her understanding and acceptance.     Plan Discussed with:   Anesthesia Plan Comments:         Anesthesia Quick Evaluation

## 2014-10-14 NOTE — Anesthesia Procedure Notes (Signed)
Epidural Patient location during procedure: OB Start time: 10/14/2014 9:56 PM  Staffing Anesthesiologist: Liyah Higham Performed by: anesthesiologist   Preanesthetic Checklist Completed: patient identified, site marked, surgical consent, pre-op evaluation, timeout performed, IV checked, risks and benefits discussed and monitors and equipment checked  Epidural Patient position: sitting Prep: site prepped and draped and DuraPrep Patient monitoring: continuous pulse ox and blood pressure Approach: midline Location: L3-L4 Injection technique: LOR air  Needle:  Needle type: Tuohy  Needle gauge: 17 G Needle length: 9 cm and 9 Needle insertion depth: 6 cm Catheter type: closed end flexible Catheter size: 19 Gauge Catheter at skin depth: 11 cm Test dose: negative  Assessment Events: blood aspirated, injection not painful, no injection resistance, negative IV test and no paresthesia  Additional Notes Discussed risk of headache, infection, bleeding, nerve injury and failed or incomplete block.  Patient voices understanding and wishes to proceed.  Epidural placed easily on first attempt.  No paresthesia.  Negative test doses - but +heme aspirated from catheter (despite withdrawing to 9.5cm and flushing.  Negative test dose throughout, but +concentrated heme).  Catheter removed and replaced at same level with -aspiration.   Negative test dose again.  No paresthesia.  Patient tolerated procedure well.  Danielle Simmons, MDReason for block:procedure for pain

## 2014-10-14 NOTE — H&P (Signed)
Danielle Lemont FillersKhalid Simmons is a 28 y.o. female presenting for induction of labor For multip with large for GA fetus, gestational thrombocytopenia.  Maternal Medical History:  Reason for admission: Nausea. Induction of labor  Contractions: Onset was 6-12 hours ago.   Frequency: regular.   Duration is approximately 40 seconds.   Perceived severity is mild.    Fetal activity: Perceived fetal activity is normal.   Last perceived fetal movement was within the past 12 hours.    Prenatal complications: Thrombocytopenia.   Prenatal Complications - Diabetes: none.    OB History   Grav Para Term Preterm Abortions TAB SAB Ect Mult Living   3 2 2  0 0 0 0 0 0 2     Past Medical History  Diagnosis Date  . No pertinent past medical history   . Bronchitis   . Beta thalassemia   . Headache     during pregnancy, Tylenol helped   Past Surgical History  Procedure Laterality Date  . No past surgeries     Family History: family history includes Hypertension in her mother; Thyroid disease in her sister. Social History:  reports that she has never smoked. She has never used smokeless tobacco. She reports that she does not drink alcohol or use illicit drugs.   Prenatal Transfer Tool  Maternal Diabetes: No Genetic Screening: Normal Maternal Ultrasounds/Referrals: Normal Fetal Ultrasounds or other Referrals:  Other:  Anatomy scan normal Maternal Substance Abuse:  No Significant Maternal Medications:  Meds include: Other: iron / Colace Significant Maternal Lab Results:  Lab values include: Group B Strep negative Other Comments:  Thalessemia Trait   Review of Systems  Constitutional: Negative.  Negative for fever and chills.  HENT: Negative for hearing loss.   Eyes: Negative.  Negative for blurred vision and double vision.  Respiratory: Negative for cough.   Cardiovascular: Negative for chest pain and palpitations.  Gastrointestinal: Negative for heartburn and nausea.  Genitourinary: Negative  for dysuria.  Musculoskeletal: Negative for myalgias.  Skin: Negative for rash.  Neurological: Negative for dizziness and headaches.  Endo/Heme/Allergies: Negative for environmental allergies. Does not bruise/bleed easily.  Psychiatric/Behavioral: Negative for depression.      Blood pressure 121/82, pulse 101, temperature 97.7 F (36.5 C), temperature source Oral, resp. rate 18, height 5\' 1"  (1.549 m), weight 88.905 kg (196 lb), last menstrual period 01/14/2014. Maternal Exam:  Uterine Assessment: Contraction strength is mild.  Contraction duration is 45 seconds. Contraction frequency is irregular.   Abdomen: Patient reports no abdominal tenderness. Fundal height is 39cm.   Estimated fetal weight is 3400 grams.   Fetal presentation: vertex  Introitus: Normal vulva. Normal vagina.  Ferning test: not done.  Nitrazine test: not done. Amniotic fluid character: not assessed.  Cervix: Cervix evaluated by digital exam.   1/40/-3  Fetal Exam Fetal Monitor Review: Mode: hand-held doppler probe.   Baseline rate: 145.  Variability: moderate (6-25 bpm).   Pattern: accelerations present and no decelerations.    Fetal State Assessment: Category I - tracings are normal.     Physical Exam  Constitutional: She is oriented to person, place, and time. She appears well-developed and well-nourished.  HENT:  Head: Normocephalic.  Cardiovascular: Normal rate and regular rhythm.   GI: Soft.  Genitourinary: Vagina normal.  Musculoskeletal: Normal range of motion.  Neurological: She is alert and oriented to person, place, and time. She has normal reflexes.  Skin: Skin is warm.  Psychiatric: She has a normal mood and affect.    Prenatal labs:  ABO, Rh: B/Positive/-- (03/26 0000) Antibody: Negative (03/26 0000) Rubella: Immune (03/26 0000) RPR: Nonreactive (03/26 0000)  HBsAg: Negative (03/26 0000)  HIV: Non-reactive (03/26 0000)  GBS: Negative (10/02 0000)   Assessment/Plan: 28 yo  G3P2002 at 39 weeks 0 days presents for IOL for LGA h/o macrosomia Patient desires epidural at some time during her labor course Continuous monitoring cytotec for cervical ripening IV hydration CBC to check for platelets.    Danielle HartINN, Danielle Simmons Danielle Simmons 10/14/2014, 9:18 AM

## 2014-10-15 ENCOUNTER — Encounter (HOSPITAL_COMMUNITY): Payer: Self-pay

## 2014-10-15 DIAGNOSIS — IMO0001 Reserved for inherently not codable concepts without codable children: Secondary | ICD-10-CM

## 2014-10-15 LAB — CBC
HCT: 28.8 % — ABNORMAL LOW (ref 36.0–46.0)
HCT: 26.7 % — ABNORMAL LOW (ref 36.0–46.0)
Hemoglobin: 9.2 g/dL — ABNORMAL LOW (ref 12.0–15.0)
Hemoglobin: 8.6 g/dL — ABNORMAL LOW (ref 12.0–15.0)
MCH: 19 pg — ABNORMAL LOW (ref 26.0–34.0)
MCH: 19 pg — ABNORMAL LOW (ref 26.0–34.0)
MCHC: 31.9 g/dL (ref 30.0–36.0)
MCHC: 32.2 g/dL (ref 30.0–36.0)
MCV: 59.5 fL — ABNORMAL LOW (ref 78.0–100.0)
MCV: 59.1 fL — ABNORMAL LOW (ref 78.0–100.0)
PLATELETS: 116 10*3/uL — AB (ref 150–400)
Platelets: 139 10*3/uL — ABNORMAL LOW (ref 150–400)
RBC: 4.84 MIL/uL (ref 3.87–5.11)
RBC: 4.52 MIL/uL (ref 3.87–5.11)
RDW: 19.8 % — ABNORMAL HIGH (ref 11.5–15.5)
RDW: 19.9 % — ABNORMAL HIGH (ref 11.5–15.5)
WBC: 11.2 10*3/uL — ABNORMAL HIGH (ref 4.0–10.5)
WBC: 11.6 10*3/uL — AB (ref 4.0–10.5)

## 2014-10-15 MED ORDER — ZOLPIDEM TARTRATE 5 MG PO TABS
5.0000 mg | ORAL_TABLET | Freq: Every evening | ORAL | Status: DC | PRN
Start: 2014-10-15 — End: 2014-10-16

## 2014-10-15 MED ORDER — BENZOCAINE-MENTHOL 20-0.5 % EX AERO
1.0000 "application " | INHALATION_SPRAY | CUTANEOUS | Status: DC | PRN
  Administered 2014-10-15: 1 via TOPICAL
  Filled 2014-10-15: qty 56

## 2014-10-15 MED ORDER — METHYLERGONOVINE MALEATE 0.2 MG/ML IJ SOLN
INTRAMUSCULAR | Status: AC
  Filled 2014-10-15: qty 1

## 2014-10-15 MED ORDER — METHYLERGONOVINE MALEATE 0.2 MG/ML IJ SOLN: 0.2000 mg | INTRAMUSCULAR | Status: DC | PRN

## 2014-10-15 MED ORDER — DIPHENHYDRAMINE HCL 25 MG PO CAPS: 25.0000 mg | ORAL_CAPSULE | Freq: Four times a day (QID) | ORAL | Status: DC | PRN

## 2014-10-15 MED ORDER — OXYTOCIN 40 UNITS IN LACTATED RINGERS INFUSION - SIMPLE MED: 62.5000 mL/h | INTRAVENOUS | Status: DC | PRN

## 2014-10-15 MED ORDER — ONDANSETRON HCL 4 MG PO TABS
4.0000 mg | ORAL_TABLET | ORAL | Status: DC | PRN
Start: 2014-10-15 — End: 2014-10-16

## 2014-10-15 MED ORDER — ONDANSETRON HCL 4 MG/2ML IJ SOLN: 4.0000 mg | INTRAMUSCULAR | Status: DC | PRN

## 2014-10-15 MED ORDER — SENNOSIDES-DOCUSATE SODIUM 8.6-50 MG PO TABS
2.0000 | ORAL_TABLET | ORAL | Status: DC
  Administered 2014-10-16: 2 via ORAL
  Filled 2014-10-15: qty 2

## 2014-10-15 MED ORDER — TETANUS-DIPHTH-ACELL PERTUSSIS 5-2.5-18.5 LF-MCG/0.5 IM SUSP
0.5000 mL | Freq: Once | INTRAMUSCULAR | Status: AC
  Administered 2014-10-16: 0.5 mL via INTRAMUSCULAR

## 2014-10-15 MED ORDER — IBUPROFEN 600 MG PO TABS
600.0000 mg | ORAL_TABLET | Freq: Four times a day (QID) | ORAL | Status: DC
  Administered 2014-10-15 – 2014-10-16 (×5): 600 mg via ORAL
  Filled 2014-10-15 (×6): qty 1

## 2014-10-15 MED ORDER — PRENATAL MULTIVITAMIN CH
1.0000 | ORAL_TABLET | Freq: Every day | ORAL | Status: DC
  Administered 2014-10-15 – 2014-10-16 (×2): 1 via ORAL
  Filled 2014-10-15 (×2): qty 1

## 2014-10-15 MED ORDER — LANOLIN HYDROUS EX OINT: TOPICAL_OINTMENT | CUTANEOUS | Status: DC | PRN

## 2014-10-15 MED ORDER — OXYCODONE-ACETAMINOPHEN 5-325 MG PO TABS: 1.0000 | ORAL_TABLET | ORAL | Status: DC | PRN

## 2014-10-15 MED ORDER — OXYCODONE-ACETAMINOPHEN 5-325 MG PO TABS: 2.0000 | ORAL_TABLET | ORAL | Status: DC | PRN

## 2014-10-15 MED ORDER — METHYLERGONOVINE MALEATE 0.2 MG/ML IJ SOLN
0.2000 mg | Freq: Once | INTRAMUSCULAR | Status: AC
  Administered 2014-10-15: 0.2 mg via INTRAMUSCULAR

## 2014-10-15 MED ORDER — WITCH HAZEL-GLYCERIN EX PADS: 1.0000 "application " | MEDICATED_PAD | CUTANEOUS | Status: DC | PRN

## 2014-10-15 MED ORDER — METHYLERGONOVINE MALEATE 0.2 MG PO TABS: 0.2000 mg | ORAL_TABLET | ORAL | Status: DC | PRN

## 2014-10-15 MED ORDER — SIMETHICONE 80 MG PO CHEW: 80.0000 mg | CHEWABLE_TABLET | ORAL | Status: DC | PRN

## 2014-10-15 MED ORDER — MISOPROSTOL 200 MCG PO TABS
1000.0000 ug | ORAL_TABLET | Freq: Once | ORAL | Status: AC
  Administered 2014-10-15: 1000 ug via RECTAL

## 2014-10-15 MED ORDER — MISOPROSTOL 200 MCG PO TABS
ORAL_TABLET | ORAL | Status: AC
  Filled 2014-10-15: qty 5

## 2014-10-15 MED ORDER — DIBUCAINE 1 % RE OINT: 1.0000 "application " | TOPICAL_OINTMENT | RECTAL | Status: DC | PRN

## 2014-10-15 NOTE — Plan of Care (Signed)
Problem: Phase I Progression Outcomes Goal: Foley catheter patent Outcome: Completed/Met Date Met:  10/15/14 Patient had foley in place due to postpartum hemorrhage.  Remove foley once bleeding stabilizes per MD.   Goal: Other Phase I Outcomes/Goals Patient's status stable at this time.  Bleeding light, no clots, no dizziness.  Removed foley and NSL IV for post partum hemorrhage protocol.

## 2014-10-15 NOTE — Progress Notes (Signed)
Post delivery CBC:  H/H- 8.6/26.7, Plt 116.  Patient stood beside bed for 5-7 minutes.  Not dizzy or lightheaded.  D/C epidural catheter per Dr. Arby BarretteHatchett. Cox, Danielle Simmons

## 2014-10-15 NOTE — Anesthesia Postprocedure Evaluation (Signed)
  Anesthesia Post-op Note  Patient: Danielle BatheDoaa Khalid Haefner  Procedure(s) Performed: * No procedures listed *  Patient Location: Mother/Baby  Anesthesia Type:Epidural--At time of anesthesia post-op assessment, pt still has epidural catheter in place d/t thrombocytopenia.  Excellent bilateral lower extremity strength.  No anesthesia complications noted.  Level of Consciousness: awake, alert , oriented and patient cooperative  Airway and Oxygen Therapy: Patient Spontanous Breathing  Post-op Pain: mild  Post-op Assessment: Post-op Vital signs reviewed, Patient's Cardiovascular Status Stable, Respiratory Function Stable, Patent Airway, No headache, No backache, No residual numbness and No residual motor weakness  Post-op Vital Signs: Reviewed and stable  Last Vitals:  Filed Vitals:   10/15/14 0704  BP:   Pulse:   Temp: 37.2 C  Resp:     Complications: No apparent anesthesia complications

## 2014-10-15 NOTE — Lactation Note (Signed)
This note was copied from the chart of Danielle Simmons. Lactation Consultation Note  P3, Ex BF.  Baby sleeping in mother's arms. Mother states she knows how to hand express. Mother denies problems or questions regarding breastfeeding. Mother states breastfeeding going well.  Mom encouraged to feed baby 8-12 times/24 hours and with feeding cues. Reviewed cluster feeding and undressing baby to wake. Mom made aware of O/P services, breastfeeding support groups, community resources, and our phone # for post-discharge questions.    Patient Name: Danielle Wyline CopasDoaa Bolio ZOXWR'UToday's Date: 10/15/2014 Reason for consult: Initial assessment   Maternal Data Has patient been taught Hand Expression?: Yes Does the patient have breastfeeding experience prior to this delivery?: Yes  Feeding Feeding Type: Breast Fed Length of feed: 25 min  LATCH Score/Interventions                      Lactation Tools Discussed/Used     Consult Status Consult Status: Follow-up Date: 10/16/14 Follow-up type: In-patient    Dahlia ByesBerkelhammer, Chanteria Haggard [facility] 10/15/2014, 1:23 PM

## 2014-10-15 NOTE — Progress Notes (Signed)
Pt now stable PP. Had heavy bleeding after delivery. Now light. Hgb- stable. VSSAF IMP/ Stable Plan/ Remove IV/Foley

## 2014-10-16 ENCOUNTER — Ambulatory Visit: Payer: Self-pay

## 2014-10-16 LAB — CBC
HCT: 21.8 % — ABNORMAL LOW (ref 36.0–46.0)
HEMOGLOBIN: 6.9 g/dL — AB (ref 12.0–15.0)
MCH: 18.9 pg — ABNORMAL LOW (ref 26.0–34.0)
MCHC: 31.7 g/dL (ref 30.0–36.0)
MCV: 59.6 fL — ABNORMAL LOW (ref 78.0–100.0)
Platelets: 143 10*3/uL — ABNORMAL LOW (ref 150–400)
RBC: 3.66 MIL/uL — ABNORMAL LOW (ref 3.87–5.11)
RDW: 19.7 % — ABNORMAL HIGH (ref 11.5–15.5)
WBC: 6.9 10*3/uL (ref 4.0–10.5)

## 2014-10-16 MED ORDER — DOCUSATE SODIUM 100 MG PO CAPS: 100.0000 mg | ORAL_CAPSULE | Freq: Two times a day (BID) | ORAL | Status: DC

## 2014-10-16 MED ORDER — OXYCODONE-ACETAMINOPHEN 5-325 MG PO TABS: 1.0000 | ORAL_TABLET | Freq: Four times a day (QID) | ORAL | Status: DC | PRN

## 2014-10-16 MED ORDER — FERROUS SULFATE 325 (65 FE) MG PO TABS: 325.0000 mg | ORAL_TABLET | Freq: Two times a day (BID) | ORAL | Status: DC

## 2014-10-16 MED ORDER — IBUPROFEN 600 MG PO TABS: 600.0000 mg | ORAL_TABLET | Freq: Four times a day (QID) | ORAL | Status: DC

## 2014-10-16 NOTE — Discharge Instructions (Signed)

## 2014-10-16 NOTE — Lactation Note (Signed)
This note was copied from the chart of Danielle Wyline CopasDoaa Cassels. Lactation Consultation Note     Follow up consult with this mom and term baby, now 1933 hours old and cluster feeding. Mom was asking for formula. I showed her she has plenty of colostrum for her baby, explained LEAD to her, and told her to just keep breast feeding, and her milk will soon transition in. Mom fine with this. Mom has slightly sore nipples from shallow lathes with using cradle hold. I showed her how to use coros cradle, wait for baby to open her mouth, and use asymetrical latch for get deeper more comfortable lat. Mom knows to call for questions/concerns.  Patient Name: Danielle Simmons ZOXWR'UToday's Date: 10/16/2014 Reason for consult: Follow-up assessment   Maternal Data    Feeding Feeding Type: Breast Fed  LATCH Score/Interventions Latch: Grasps breast easily, tongue down, lips flanged, rhythmical sucking.  Audible Swallowing: Spontaneous and intermittent  Type of Nipple: Everted at rest and after stimulation  Comfort (Breast/Nipple): Soft / non-tender     Hold (Positioning): Assistance needed to correctly position infant at breast and maintain latch. Intervention(s): Breastfeeding basics reviewed;Support Pillows;Position options;Skin to skin  LATCH Score: 9  Lactation Tools Discussed/Used     Consult Status Consult Status: Complete Follow-up type: Call as needed    Alfred LevinsLee, Kyrielle Urbanski Anne 10/16/2014, 5:03 PM

## 2014-10-16 NOTE — Progress Notes (Signed)
CBC drawn at 1300.  Critical hemoglobin of 6.9 called to Dr. Chestine Sporelark.  Ok to proceed with discharge home.  Continue iron supplements as per discharge orders per Dr. Chestine Sporelark. Cox, [name]

## 2014-10-16 NOTE — Discharge Summary (Signed)
Obstetric Discharge Summary Reason for Admission: induction of labor Prenatal Procedures: none Intrapartum Procedures: spontaneous vaginal delivery Postpartum Procedures: none Complications-Operative and Postpartum: 2 degree perineal laceration and hemorrhage Hemoglobin  Date Value Ref Range Status  10/15/2014 8.6* 12.0 - 15.0 g/dL Final     HCT  Date Value Ref Range Status  10/15/2014 26.7* 36.0 - 46.0 % Final    Physical Exam:  General: alert, cooperative, appears stated age and no distress Lochia: appropriate Uterine Fundus: firm DVT Evaluation: No evidence of DVT seen on physical exam.  Discharge Diagnoses: Term Pregnancy-delivered  Discharge Information: Date: 10/16/2014 Activity: pelvic rest Diet: routine Medications: PNV, Ibuprofen, Iron and Percocet Condition: stable Instructions: refer to practice specific booklet Discharge to: home Follow-up Information   Follow up with PINN, Sanjuana MaeWALDA STACIA, MD In 4 weeks.   Specialty:  Obstetrics and Gynecology   Contact information:   [address] [place] [address] [phone]       Newborn Data: Live born female  Birth Weight: 9 lb 6.6 oz (4270 g) APGAR: 9, 9  Home with mother.  Marlow BaarsCLARK, Kimetha Trulson 10/16/2014, 9:34 AM

## 2014-10-16 NOTE — Progress Notes (Signed)
Patient is eating, ambulating, voiding.  Pain control is good.  Bleeding is minimal PPH immediately after delivery.  Responded quickly to uterotonics and bleeding has been minimal since that time.  Hgb 9.2 to 8.6.  She is asymptomatic w ambulation (no dizziness/lightheadedness).   Filed Vitals:   10/15/14 0930 10/15/14 1205 10/15/14 2030 10/16/14 0552  BP: 125/76 124/65 122/70 118/78  Pulse:  90 92 72  Temp:  98.3 F (36.8 C) 98.4 F (36.9 C) 98 F (36.7 C)  TempSrc:  Oral Oral Oral  Resp:  21 18 20   Height:      Weight:      SpO2:        Fundus firm Perineum without swelling. Peripad w scant heme  Lab Results  Component Value Date   WBC 11.6* 10/15/2014   HGB 8.6* 10/15/2014   HCT 26.7* 10/15/2014   MCV 59.1* 10/15/2014   PLT 116* 10/15/2014    --/--/B POS (10/20 0830)/R Immune  A/P Post partum day 1. PPH:  2/2 uterine atony.  Fundus firm w scant bleeding today.  Pt asymptomatic.  Hgb 8.6.  Stable for discharge.  Will d/c w PO iron.  Known GTP:  Plt 122 on admission, stable at post delivery cbc (116).     Cinnamon LakeLARK, [facility] 2DYANNA

## 2014-10-17 LAB — TYPE AND SCREEN
ABO/RH(D): B POS
ANTIBODY SCREEN: NEGATIVE
Unit division: 0
Unit division: 0

## 2014-10-27 ENCOUNTER — Encounter (HOSPITAL_COMMUNITY): Payer: Self-pay

## 2016-12-26 HISTORY — PX: OTHER SURGICAL HISTORY: SHX169

## 2017-10-11 DIAGNOSIS — H5213 Myopia, bilateral: Secondary | ICD-10-CM | POA: Diagnosis not present

## 2018-04-09 DIAGNOSIS — J029 Acute pharyngitis, unspecified: Secondary | ICD-10-CM | POA: Diagnosis not present

## 2018-04-09 DIAGNOSIS — R6889 Other general symptoms and signs: Secondary | ICD-10-CM | POA: Diagnosis not present

## 2018-05-09 DIAGNOSIS — J019 Acute sinusitis, unspecified: Secondary | ICD-10-CM | POA: Diagnosis not present

## 2018-05-09 DIAGNOSIS — R05 Cough: Secondary | ICD-10-CM | POA: Diagnosis not present

## 2018-05-10 DIAGNOSIS — Z13228 Encounter for screening for other metabolic disorders: Secondary | ICD-10-CM | POA: Diagnosis not present

## 2018-05-10 DIAGNOSIS — Z13220 Encounter for screening for lipoid disorders: Secondary | ICD-10-CM | POA: Diagnosis not present

## 2018-07-08 ENCOUNTER — Other Ambulatory Visit: Payer: Self-pay

## 2018-07-08 ENCOUNTER — Emergency Department (HOSPITAL_COMMUNITY)
Admission: EM | Admit: 2018-07-08 | Discharge: 2018-07-08 | Disposition: A | Discharge status: Home / Self Care | Payer: Self-pay | Attending: Emergency Medicine | Admitting: Emergency Medicine

## 2018-07-08 ENCOUNTER — Encounter (HOSPITAL_COMMUNITY): Payer: Self-pay | Admitting: Emergency Medicine

## 2018-07-08 ENCOUNTER — Emergency Department (HOSPITAL_COMMUNITY): Payer: Self-pay

## 2018-07-08 DIAGNOSIS — Z79899 Other long term (current) drug therapy: Secondary | ICD-10-CM | POA: Insufficient documentation

## 2018-07-08 DIAGNOSIS — R0789 Other chest pain: Secondary | ICD-10-CM | POA: Insufficient documentation

## 2018-07-08 DIAGNOSIS — I10 Essential (primary) hypertension: Secondary | ICD-10-CM | POA: Insufficient documentation

## 2018-07-08 LAB — CBC
HCT: 39 % (ref 36.0–46.0)
Hemoglobin: 12 g/dL (ref 12.0–15.0)
MCH: 19.2 pg — ABNORMAL LOW (ref 26.0–34.0)
MCHC: 30.8 g/dL (ref 30.0–36.0)
MCV: 62.4 fL — ABNORMAL LOW (ref 78.0–100.0)
Platelets: 207 10*3/uL (ref 150–400)
RBC: 6.25 MIL/uL — ABNORMAL HIGH (ref 3.87–5.11)
RDW: 18.7 % — ABNORMAL HIGH (ref 11.5–15.5)
WBC: 7.7 10*3/uL (ref 4.0–10.5)

## 2018-07-08 LAB — I-STAT TROPONIN, ED
TROPONIN I, POC: 0 ng/mL (ref 0.00–0.08)
Troponin i, poc: 0 ng/mL (ref 0.00–0.08)

## 2018-07-08 LAB — BASIC METABOLIC PANEL
Anion gap: 8 (ref 5–15)
BUN: 11 mg/dL (ref 6–20)
CO2: 26 mmol/L (ref 22–32)
Calcium: 9.8 mg/dL (ref 8.9–10.3)
Chloride: 103 mmol/L (ref 98–111)
Creatinine, Ser: 0.49 mg/dL (ref 0.44–1.00)
GFR calc Af Amer: >60 mL/min (ref >60)
GFR calc non Af Amer: >60 mL/min (ref >60)
Glucose, Bld: 94 mg/dL (ref 70–99)
Potassium: 3.8 mmol/L (ref 3.5–5.1)
Sodium: 137 mmol/L (ref 135–145)

## 2018-07-08 LAB — CBG MONITORING, ED: GLUCOSE-CAPILLARY: 84 mg/dL (ref 70–99)

## 2018-07-08 LAB — I-STAT BETA HCG BLOOD, ED (MC, WL, AP ONLY): I-stat hCG, quantitative: <5 m[IU]/mL (ref <5)

## 2018-07-08 LAB — D-DIMER, QUANTITATIVE: D-Dimer, Quant: 0.36 ug/mL-FEU (ref 0.00–0.50)

## 2018-07-08 MED ORDER — KETOROLAC TROMETHAMINE 15 MG/ML IJ SOLN
15.0000 mg | Freq: Once | INTRAMUSCULAR | Status: AC
  Administered 2018-07-08: 15 mg via INTRAVENOUS
  Filled 2018-07-08: qty 1

## 2018-07-08 MED ORDER — IBUPROFEN 400 MG PO TABS: 400.0000 mg | ORAL_TABLET | Freq: Three times a day (TID) | ORAL | 0 refills | Status: AC | PRN

## 2018-07-08 NOTE — ED Provider Notes (Signed)
[facility] Provider [facility]   CSN: [id number] Arrival date & time: 07/08/18  1528     History   Chief Complaint Chief Complaint  Patient presents with  . Chest Pain  . Nausea  . Shortness of Breath  . Headache    HPI Danielle Simmons is a 32 y.o. female presenting for chest pain that began suddenly at approximately noon today.  Patient describes her pain as a tightness in the center of her chest that radiates to her left arm and the left side of her neck.  Patient states that she has not taken anything for her pain.  She describes her pain as moderate in intensity and worsened with deep breaths.  Patient endorses nausea, states she has not vomited.  Patient also endorses headache states that it is her chest pain radiating up to her head.  Patient denies history of cancer, recent surgery, leg swelling, leg pain, history of pulmonary embolism or DVT or recent immobilization.  Patient denies history of cough, hemoptysis, fever, abdominal pain, vomiting, diarrhea HPI  History reviewed. No pertinent past medical history.  There are no active problems to display for this patient.   Past Surgical History:  Procedure Laterality Date  . prolapsed uterus       OB History   None      Home Medications    Prior to Admission medications   Medication Sig Start Date End Date Taking? Authorizing Provider  acetaminophen (TYLENOL) 500 MG tablet Take 1,000 mg by mouth every 6 (six) hours as needed for headache.   Yes [provider]  Multiple Vitamin (MULTIVITAMIN WITH MINERALS) TABS tablet Take 1 tablet by mouth daily.   Yes [provider]  ibuprofen (ADVIL,MOTRIN) 400 MG tablet Take 1 tablet (400 mg total) by mouth every 8 (eight) hours as needed for up to 5 days. 07/08/18 07/13/18  Bill SalinasMorelli, Adrionna Delcid A, PA-C    Family History History reviewed. No pertinent family history.  Social History Social History   Tobacco Use  . Smoking  status: Never Smoker  Substance Use Topics  . Alcohol use: Not Currently  . Drug use: Not Currently     Allergies   Banana   Review of Systems Review of Systems  Constitutional: Negative.  Negative for chills, fatigue and fever.  HENT: Negative.  Negative for congestion, rhinorrhea, sore throat and trouble swallowing.   Eyes: Negative.  Negative for visual disturbance.  Respiratory: Positive for shortness of breath. Negative for cough and chest tightness.   Cardiovascular: Positive for chest pain. Negative for leg swelling.  Gastrointestinal: Positive for nausea. Negative for abdominal pain, blood in stool, diarrhea and vomiting.  Genitourinary: Negative.  Negative for difficulty urinating, dysuria, flank pain, hematuria and vaginal discharge.  Musculoskeletal: Positive for neck pain. Negative for arthralgias and myalgias.  Skin: Negative.  Negative for rash.  Neurological: Positive for headaches. Negative for dizziness, syncope, weakness and light-headedness.     Physical Exam Updated Vital Signs BP 119/78   Pulse 91   Temp 98 F (36.7 C) (Oral)   Resp (!) 22   Ht 5\' 1"  (1.549 m)   Wt 77.1 kg (170 lb)   LMP 07/05/2018   SpO2 99%   BMI 32.12 kg/m   Physical Exam  Constitutional: She is oriented to person, place, and time. She appears well-developed and well-nourished. She does not appear ill. No distress.  HENT:  Head: Normocephalic and atraumatic.  Mouth/Throat: Oropharynx is clear and moist.  Eyes: Pupils are equal, round, and reactive to light. EOM are normal.  Neck: Normal range of motion. Neck supple. No JVD present. No tracheal deviation present.  Cardiovascular: Normal rate, regular rhythm and intact distal pulses.  No murmur heard. Pulses:      Dorsalis pedis pulses are 3+ on the right side, and 3+ on the left side.       Posterior tibial pulses are 3+ on the right side, and 3+ on the left side.  Pulmonary/Chest: Effort normal and breath sounds normal. No  accessory muscle usage. No respiratory distress. She has no decreased breath sounds. She exhibits tenderness. She exhibits no crepitus, no edema, no deformity and no swelling.    Abdominal: Soft. Bowel sounds are normal. She exhibits no distension. There is no tenderness. There is no rebound and no guarding.  Musculoskeletal: Normal range of motion.       Right lower leg: Normal. She exhibits no tenderness and no edema.       Left lower leg: Normal. She exhibits no tenderness and no edema.  Neurological: She is alert and oriented to person, place, and time. She has normal strength. No cranial nerve deficit.  Skin: Skin is warm and dry. Capillary refill takes less than 2 seconds.  Psychiatric: She has a normal mood and affect. Her behavior is normal.     ED Treatments / Results  Labs (all labs ordered are listed, but only abnormal results are displayed) Labs Reviewed  CBC - Abnormal; Notable for the following components:      Result Value   RBC 6.25 (*)    MCV 62.4 (*)    MCH 19.2 (*)    RDW 18.7 (*)    All other components within normal limits  BASIC METABOLIC PANEL  D-DIMER, QUANTITATIVE (NOT AT [facility])  I-STAT TROPONIN, ED  I-STAT BETA HCG BLOOD, ED ([facility], [facility], [facility] ONLY)  I-STAT TROPONIN, ED  CBG MONITORING, ED    EKG EKG Interpretation  Date/Time:  Sunday July 08 2018 15:40:32 EDT Ventricular Rate:  85 PR Interval:  154 QRS Duration: 92 QT Interval:  374 QTC Calculation: 445 R Axis:   86 Text Interpretation:  Normal sinus rhythm Nonspecific T wave abnormality Abnormal ECG No old tracing to compare Confirmed by Raeford Razor [phone]) on 07/08/2018 4:32:33 PM   Radiology Dg Chest 2 View  Result Date: 07/08/2018 CLINICAL DATA:  Chest tightness over the last 2 hours. Symptoms radiate to the left arm. EXAM: CHEST - 2 VIEW COMPARISON:  None. FINDINGS: Heart size is normal. Mediastinal shadows are normal. There is central bronchial thickening but no infiltrate, collapse or  effusion. No significant bone finding. IMPRESSION: Bronchial thickening pattern. No consolidation or collapse. No cardiovascular finding. Electronically Signed   By: Paulina Fusi M.D.   On: 07/08/2018 16:03    Procedures Procedures (including critical care time)  Medications Ordered in ED Medications  ketorolac (TORADOL) 15 MG/ML injection 15 mg (15 mg Intravenous Given 07/08/18 1659)     Initial Impression / Assessment and Plan / ED Course  I have reviewed the triage vital signs and the nursing notes.  Pertinent labs & imaging results that were available during my care of the patient were reviewed by me and considered in my medical decision making (see chart for details).  Clinical Course as of Jul 08 2254  Wynelle Link Jul 08, 2018  2035 I have informed the RN that the patient's d-dimer is still not resulted.  She is speaking to lab  regarding this.   [BM]    Clinical Course User Index [BM] Bill Salinas, PA-C   Patient is to be discharged with recommendation to follow up with PCP in regards to today's hospital visit. Chest pain is not likely of cardiac or pulmonary etiology d/t presentation, perc negative, negative d-dimer, VSS/hypertension resolved, no tracheal deviation, no JVD or new murmur, RRR, breath sounds equal bilaterally, EKG without acute abnormalities, negative troponin x2, and negative CXR.  Patient resting comfortably in room.  Patient denies nausea at this time. Patient has been advised to return to the ED is CP becomes exertional, associated with diaphoresis or nausea, radiates to left jaw/arm, worsens or becomes concerning in any way. Pt appears reliable for follow up and is agreeable to discharge.  Patient's pain completely improved with Toradol injection.  Patient denies pain at this time.  Patient states that she feels well and wants to be discharged at this time.  Patient believes that there may be an aspect of anxiety to her chest pain today, she states that the pain  started after she noticed her blood pressure was elevated.  I have encouraged the patient to follow-up with her primary care provider for evaluation and treatment of her high blood pressure. Heart Score of 1 - for moderately suspicious history.  At this time there does not appear to be any evidence of an acute emergency medical condition and the patient appears stable for discharge with appropriate outpatient follow up. Diagnosis was discussed with patient who verbalizes understanding and is agreeable to discharge. I have discussed return precautions with patient and family who verbalize understanding of return precautions. Patient strongly encouraged to follow-up with their PCP.  Patient's case discussed with Dr. Juleen China who agrees with plan to discharge with follow-up.     This note was dictated using DragonOne dictation software; please contact for any inconsistencies within the note.   Final Clinical Impressions(s) / ED Diagnoses   Final diagnoses:  Atypical chest pain  Hypertension, unspecified type    ED Discharge Orders        Ordered    ibuprofen (ADVIL,MOTRIN) 400 MG tablet  Every 8 hours PRN     07/08/18 2244       Bill Salinas, PA-C 07/08/18 2259    Raeford Razor, MD 07/09/18 1531

## 2018-07-08 NOTE — ED Notes (Signed)
Pt taken back no available room

## 2018-07-08 NOTE — Discharge Instructions (Addendum)
Please follow-up with your primary care provider regarding your visit today. Please return to the emergency department for any new or worsening symptoms. Your blood pressure was initially elevated today please follow-up with your primary care provider for further evaluation. You may use the ibuprofen as prescribed for pain if needed.  Contact a health care provider if: Your chest pain does not go away. You have a rash with blisters on your chest. You have a fever. You have chills. Get help right away if: Your chest pain is worse. You have a cough that gets worse, or you cough up blood. You have severe pain in your abdomen. You have severe weakness. You faint. You have sudden, unexplained chest discomfort. You have sudden, unexplained discomfort in your arms, back, neck, or jaw. You have shortness of breath at any time. You suddenly start to sweat, or your skin gets clammy. You feel nauseous or you vomit. You suddenly feel light-headed or dizzy. Your heart begins to beat quickly, or it feels like it is skipping beats. Contact a health care provider if: You think you are having a reaction to a medicine you are taking. You have headaches that keep coming back (recurring). You feel dizzy. You have swelling in your ankles. You have trouble with your vision. Get help right away if: You develop a severe headache or confusion. You have unusual weakness or numbness. You feel faint. You have severe pain in your chest or abdomen. You vomit repeatedly. You have trouble breathing.

## 2018-07-08 NOTE — ED Triage Notes (Signed)
Pt presents with chest tightness x 2 hrs that radiates down L arm and into neck with headache; pt denies cardiac hx; pt states its been on and off x 2 months but got worse today and wants it checked; pt also reports she has been checking her BP at home with readings in the 150's

## 2018-07-08 NOTE — ED Notes (Signed)
Patient verbalizes understanding of discharge instructions. Opportunity for questioning and answers were provided. Armband removed by staff, pt discharged from ED ambulatory.   

## 2018-07-09 ENCOUNTER — Encounter (HOSPITAL_COMMUNITY): Payer: Self-pay

## 2018-07-13 DIAGNOSIS — Z6834 Body mass index (BMI) 34.0-34.9, adult: Secondary | ICD-10-CM | POA: Diagnosis not present

## 2018-07-13 DIAGNOSIS — Z01419 Encounter for gynecological examination (general) (routine) without abnormal findings: Secondary | ICD-10-CM | POA: Diagnosis not present

## 2018-07-13 DIAGNOSIS — Z124 Encounter for screening for malignant neoplasm of cervix: Secondary | ICD-10-CM | POA: Diagnosis not present

## 2019-01-02 DIAGNOSIS — G43109 Migraine with aura, not intractable, without status migrainosus: Secondary | ICD-10-CM | POA: Diagnosis not present

## 2019-01-02 DIAGNOSIS — Z0000 Encounter for general adult medical examination without abnormal findings: Secondary | ICD-10-CM | POA: Diagnosis not present

## 2019-01-02 DIAGNOSIS — R635 Abnormal weight gain: Secondary | ICD-10-CM | POA: Diagnosis not present

## 2019-01-02 DIAGNOSIS — Z23 Encounter for immunization: Secondary | ICD-10-CM | POA: Diagnosis not present

## 2019-01-02 DIAGNOSIS — E669 Obesity, unspecified: Secondary | ICD-10-CM | POA: Diagnosis not present

## 2019-01-22 ENCOUNTER — Other Ambulatory Visit: Payer: Self-pay | Admitting: Internal Medicine

## 2019-01-22 DIAGNOSIS — R748 Abnormal levels of other serum enzymes: Secondary | ICD-10-CM | POA: Diagnosis not present

## 2019-01-22 DIAGNOSIS — D563 Thalassemia minor: Secondary | ICD-10-CM | POA: Diagnosis not present

## 2019-01-22 DIAGNOSIS — E669 Obesity, unspecified: Secondary | ICD-10-CM | POA: Diagnosis not present

## 2019-01-22 DIAGNOSIS — R718 Other abnormality of red blood cells: Secondary | ICD-10-CM | POA: Diagnosis not present

## 2019-01-23 ENCOUNTER — Ambulatory Visit
Admission: RE | Admit: 2019-01-23 | Discharge: 2019-01-23 | Disposition: A | Discharge status: Home / Self Care | Payer: BLUE CROSS/BLUE SHIELD | Source: Ambulatory Visit | Attending: Internal Medicine | Admitting: Internal Medicine

## 2019-01-23 DIAGNOSIS — K760 Fatty (change of) liver, not elsewhere classified: Secondary | ICD-10-CM | POA: Diagnosis not present

## 2019-01-23 DIAGNOSIS — R748 Abnormal levels of other serum enzymes: Secondary | ICD-10-CM

## 2019-03-11 ENCOUNTER — Telehealth: Payer: Self-pay | Admitting: Hematology

## 2019-03-11 ENCOUNTER — Encounter: Payer: Self-pay | Admitting: Hematology

## 2019-03-11 NOTE — Telephone Encounter (Signed)
A new hem appt has been scheduled for the pt to see Dr. Candise Che on 3/31 at 1pm. Letter mailed.

## 2019-03-25 ENCOUNTER — Telehealth: Payer: Self-pay | Admitting: *Deleted

## 2019-03-25 NOTE — Telephone Encounter (Signed)
Attempted to contact  patient per [facility] current rescheduling guidelines. Left voice mail: Dr. Candise Che recommends that patient reschedule appt for 3/31 and come in 2 months. Asked patient to contact office this afternoon.

## 2019-03-26 ENCOUNTER — Inpatient Hospital Stay: Payer: BLUE CROSS/BLUE SHIELD | Attending: Hematology | Admitting: Hematology

## 2019-07-27 DIAGNOSIS — K760 Fatty (change of) liver, not elsewhere classified: Secondary | ICD-10-CM | POA: Insufficient documentation

## 2019-09-03 DIAGNOSIS — Z01419 Encounter for gynecological examination (general) (routine) without abnormal findings: Secondary | ICD-10-CM | POA: Diagnosis not present

## 2019-09-03 DIAGNOSIS — Z6833 Body mass index (BMI) 33.0-33.9, adult: Secondary | ICD-10-CM | POA: Diagnosis not present

## 2019-09-03 DIAGNOSIS — Z124 Encounter for screening for malignant neoplasm of cervix: Secondary | ICD-10-CM | POA: Diagnosis not present

## 2019-09-03 DIAGNOSIS — Z30432 Encounter for removal of intrauterine contraceptive device: Secondary | ICD-10-CM | POA: Diagnosis not present

## 2020-04-08 DIAGNOSIS — N762 Acute vulvitis: Secondary | ICD-10-CM | POA: Diagnosis not present

## 2020-04-08 DIAGNOSIS — N898 Other specified noninflammatory disorders of vagina: Secondary | ICD-10-CM | POA: Diagnosis not present

## 2020-05-28 DIAGNOSIS — E669 Obesity, unspecified: Secondary | ICD-10-CM | POA: Diagnosis not present

## 2020-05-28 DIAGNOSIS — R748 Abnormal levels of other serum enzymes: Secondary | ICD-10-CM | POA: Diagnosis not present

## 2020-05-28 DIAGNOSIS — M5410 Radiculopathy, site unspecified: Secondary | ICD-10-CM | POA: Diagnosis not present

## 2020-06-02 DIAGNOSIS — R748 Abnormal levels of other serum enzymes: Secondary | ICD-10-CM | POA: Diagnosis not present

## 2020-06-11 DIAGNOSIS — Z713 Dietary counseling and surveillance: Secondary | ICD-10-CM | POA: Diagnosis not present

## 2020-06-18 DIAGNOSIS — Z713 Dietary counseling and surveillance: Secondary | ICD-10-CM | POA: Diagnosis not present

## 2020-06-25 DIAGNOSIS — Z713 Dietary counseling and surveillance: Secondary | ICD-10-CM | POA: Diagnosis not present

## 2020-07-14 DIAGNOSIS — K7581 Nonalcoholic steatohepatitis (NASH): Secondary | ICD-10-CM | POA: Diagnosis not present

## 2020-07-14 DIAGNOSIS — K760 Fatty (change of) liver, not elsewhere classified: Secondary | ICD-10-CM | POA: Diagnosis not present

## 2020-07-27 DIAGNOSIS — Z0000 Encounter for general adult medical examination without abnormal findings: Secondary | ICD-10-CM | POA: Diagnosis not present

## 2020-07-27 DIAGNOSIS — Z13220 Encounter for screening for lipoid disorders: Secondary | ICD-10-CM | POA: Diagnosis not present

## 2020-09-24 DIAGNOSIS — H5213 Myopia, bilateral: Secondary | ICD-10-CM | POA: Diagnosis not present

## 2020-12-11 DIAGNOSIS — N926 Irregular menstruation, unspecified: Secondary | ICD-10-CM | POA: Diagnosis not present

## 2020-12-11 DIAGNOSIS — R102 Pelvic and perineal pain: Secondary | ICD-10-CM | POA: Diagnosis not present

## 2020-12-11 DIAGNOSIS — N814 Uterovaginal prolapse, unspecified: Secondary | ICD-10-CM | POA: Diagnosis not present

## 2020-12-11 DIAGNOSIS — M545 Low back pain, unspecified: Secondary | ICD-10-CM | POA: Diagnosis not present

## 2021-01-20 IMAGING — US US ABDOMEN LIMITED
1 series · 14 of 25 positions shown · non-contrast
Comparison: None.

CLINICAL DATA: Elevated LFTs

EXAM:
ULTRASOUND ABDOMEN LIMITED RIGHT UPPER QUADRANT

[Series 1: us abdomen limited · 0.23mm/px · 14 of 42 slices shown]
[im 1/42]
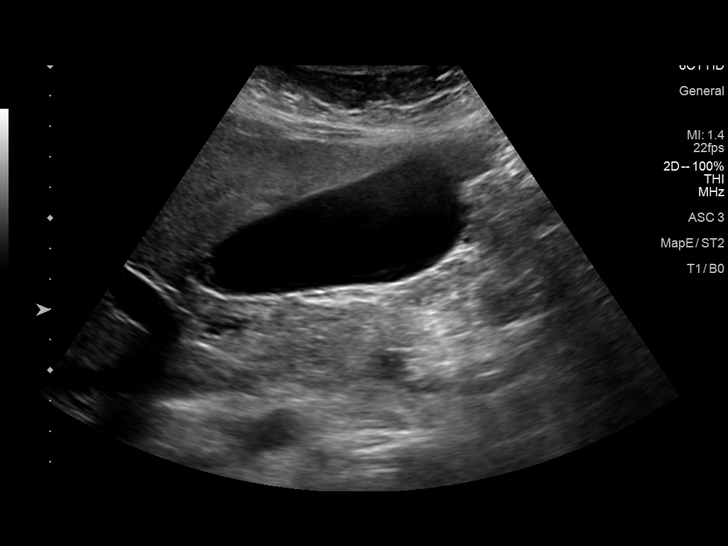
[im 4/42]
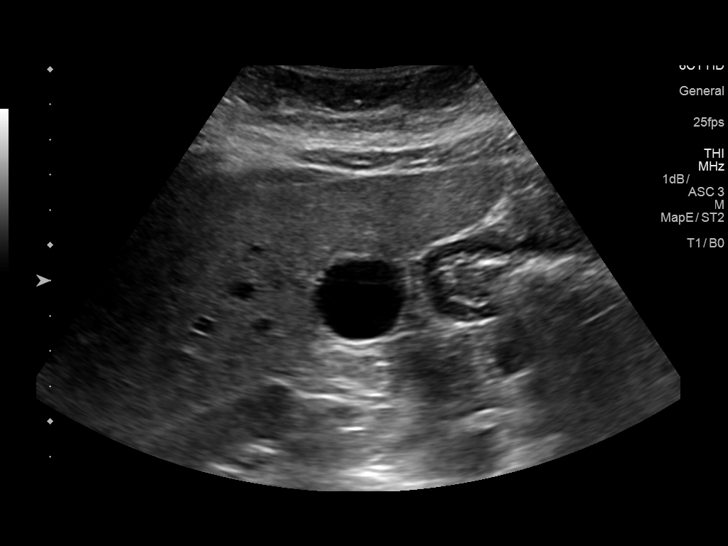
[im 7/42]
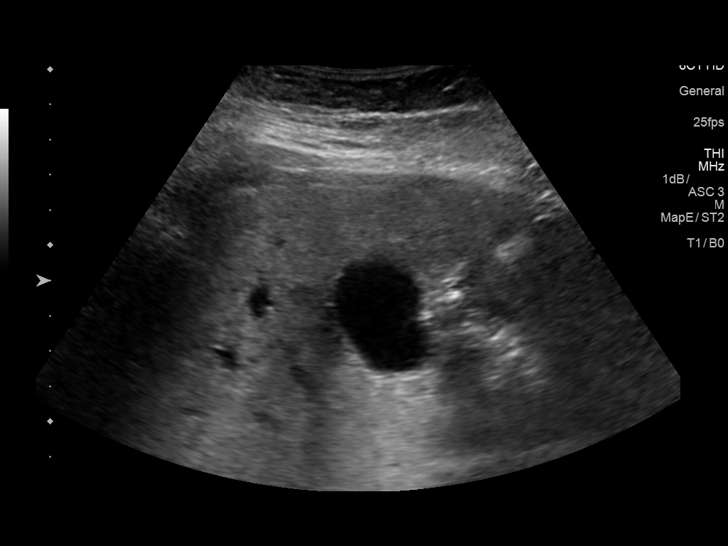
[im 11/42]
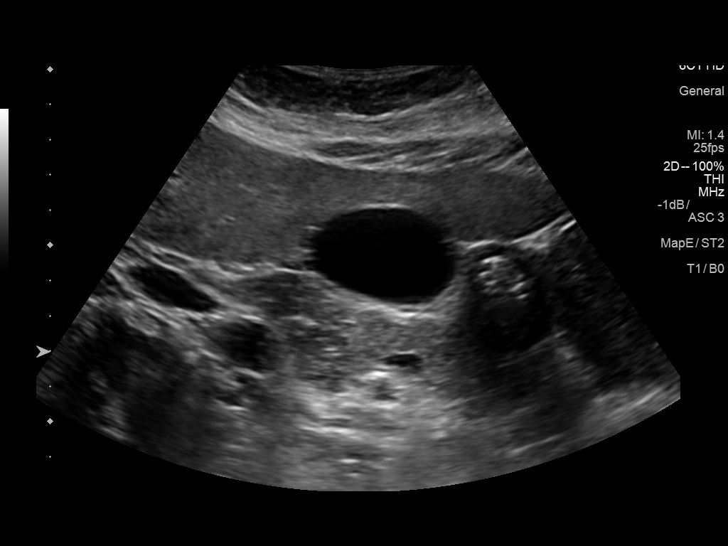
[im 14/42]
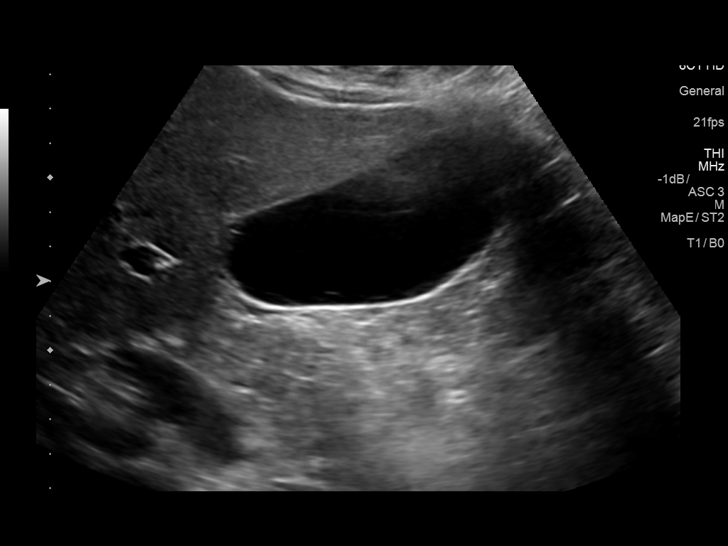
[im 16/42]
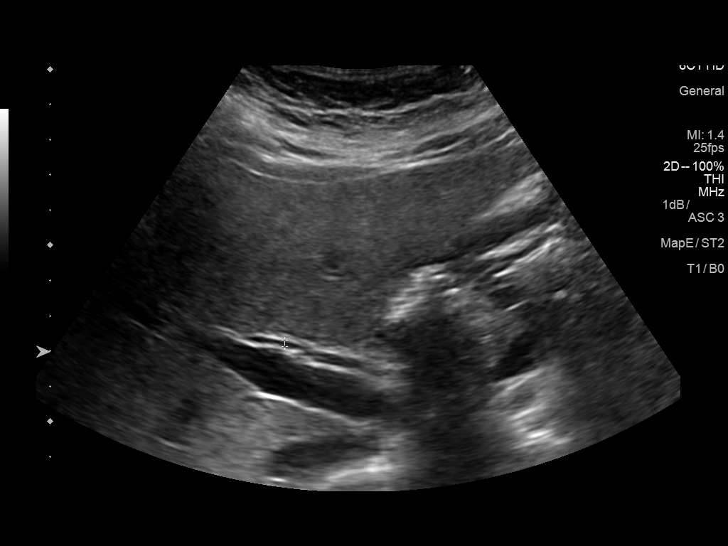
[im 19/42]
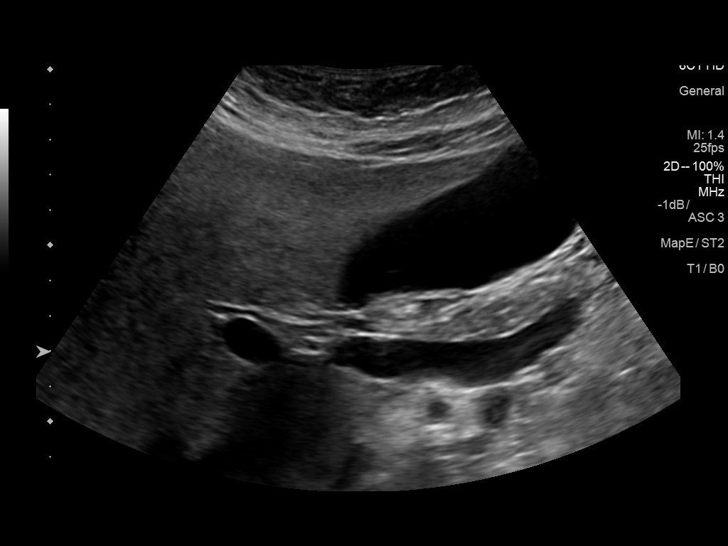
[im 23/42]
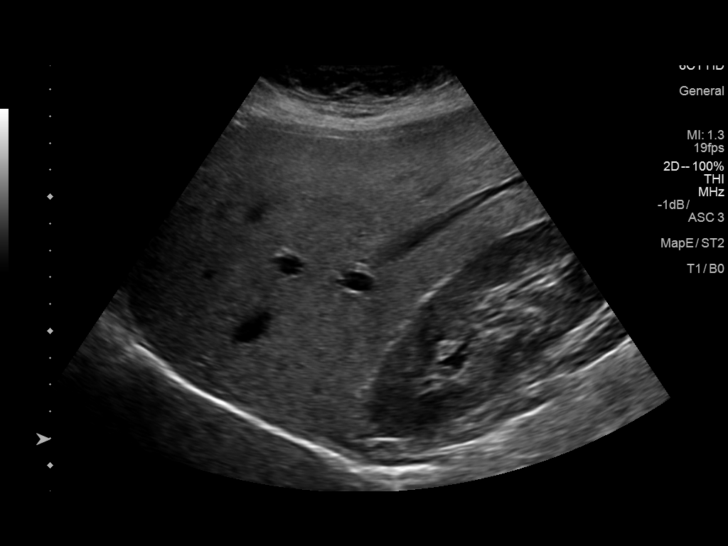
[im 26/42]
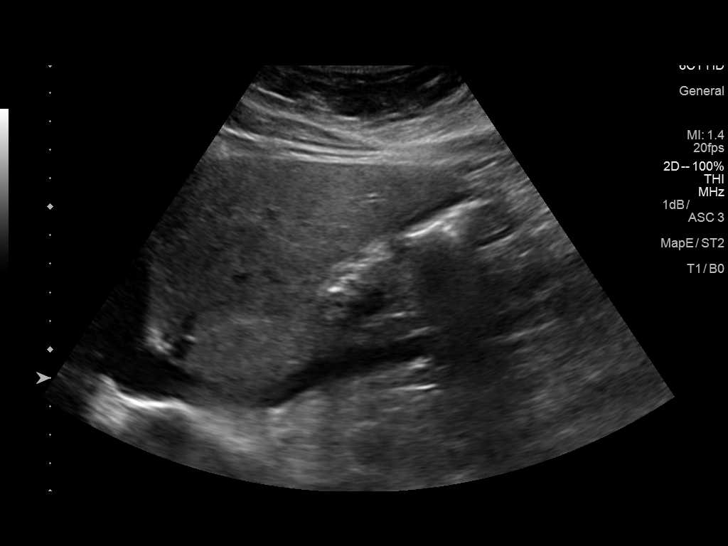
[im 28/42]
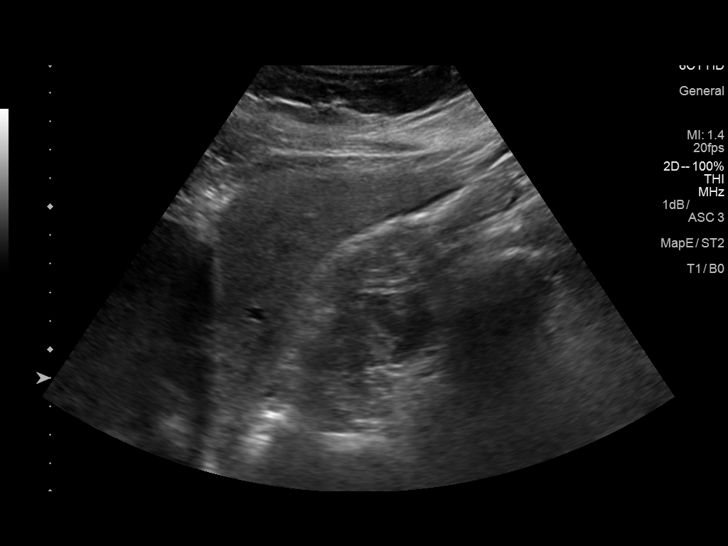
[im 31/42]
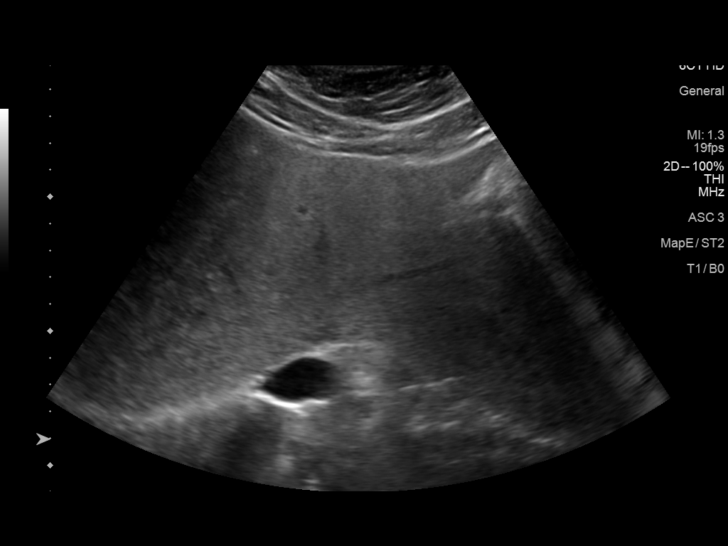
[im 35/42]
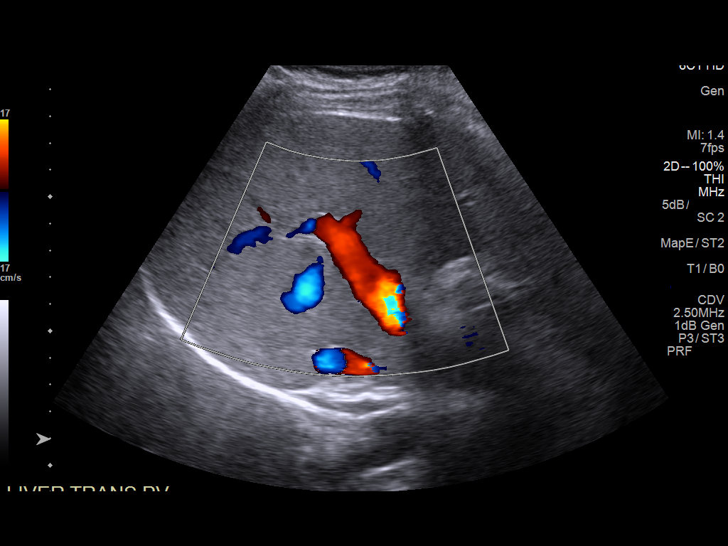
[im 38/42]
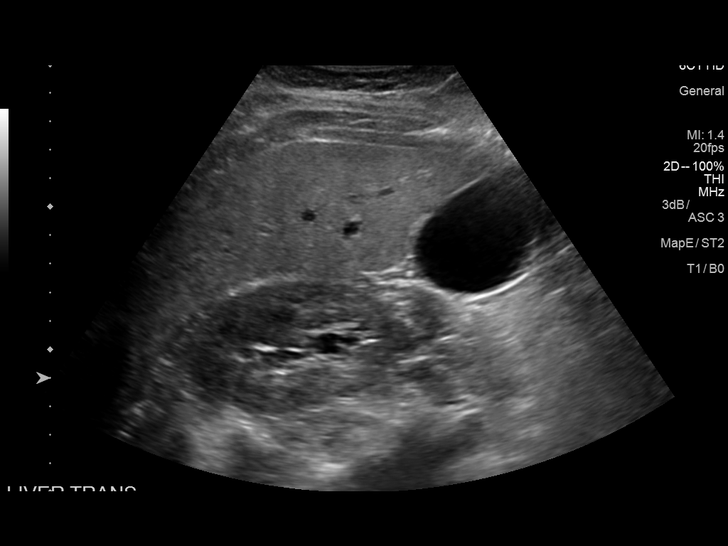
[im 42/42]
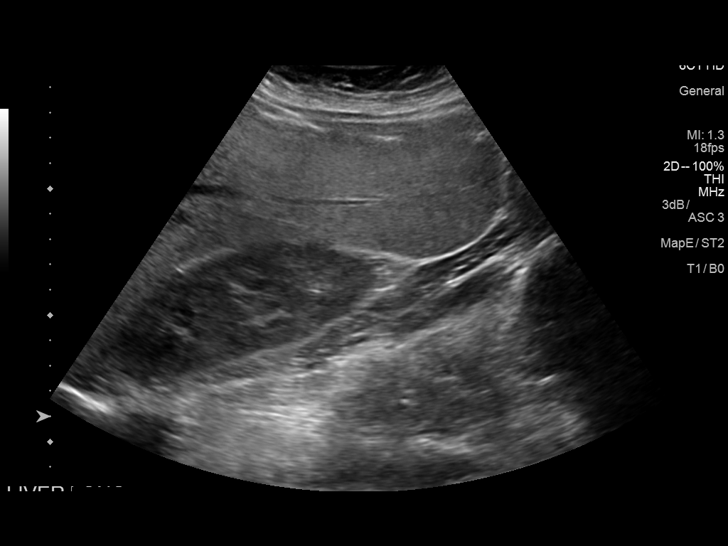

[14 of 25 positions shown; findings below may reference images not displayed]

FINDINGS: Gallbladder:

No gallstones or wall thickening visualized. No sonographic Murphy
sign noted by sonographer.

Common bile duct:

Diameter: Normal caliber, 2 mm

Liver:

Increased echotexture compatible with fatty infiltration. No focal
abnormality or biliary ductal dilatation. Portal vein is patent on
color Doppler imaging with normal direction of blood flow towards
the liver.
IMPRESSION: Fatty infiltration of the liver.  No acute findings.

## 2021-01-24 DIAGNOSIS — Z20822 Contact with and (suspected) exposure to covid-19: Secondary | ICD-10-CM | POA: Diagnosis not present

## 2021-01-29 DIAGNOSIS — Z20822 Contact with and (suspected) exposure to covid-19: Secondary | ICD-10-CM | POA: Diagnosis not present

## 2021-02-23 DIAGNOSIS — R102 Pelvic and perineal pain: Secondary | ICD-10-CM | POA: Diagnosis not present

## 2021-05-11 DIAGNOSIS — E559 Vitamin D deficiency, unspecified: Secondary | ICD-10-CM | POA: Diagnosis not present

## 2021-05-11 DIAGNOSIS — Z0000 Encounter for general adult medical examination without abnormal findings: Secondary | ICD-10-CM | POA: Diagnosis not present

## 2021-05-11 DIAGNOSIS — Z13220 Encounter for screening for lipoid disorders: Secondary | ICD-10-CM | POA: Diagnosis not present

## 2021-11-25 NOTE — Progress Notes (Signed)
Erroneous encounter

## 2021-11-29 ENCOUNTER — Encounter: Payer: Self-pay | Admitting: Family

## 2021-11-29 DIAGNOSIS — Z7689 Persons encountering health services in other specified circumstances: Secondary | ICD-10-CM

## 2022-01-01 ENCOUNTER — Encounter: Payer: Self-pay | Admitting: Emergency Medicine

## 2022-01-01 ENCOUNTER — Other Ambulatory Visit: Payer: Self-pay

## 2022-01-01 ENCOUNTER — Ambulatory Visit
Admission: EM | Admit: 2022-01-01 | Discharge: 2022-01-01 | Disposition: A | Discharge status: Home / Self Care | Payer: BLUE CROSS/BLUE SHIELD

## 2022-01-01 DIAGNOSIS — R519 Headache, unspecified: Secondary | ICD-10-CM

## 2022-01-01 DIAGNOSIS — R079 Chest pain, unspecified: Secondary | ICD-10-CM

## 2022-01-01 DIAGNOSIS — I10 Essential (primary) hypertension: Secondary | ICD-10-CM

## 2022-01-01 HISTORY — DX: Essential (primary) hypertension: I10

## 2022-01-01 NOTE — ED Triage Notes (Signed)
Pt sts hypertension with hx of same; pt sts restarted her meds 2 days ago; pt sts some fullness and pain in neck and ears with some congestion

## 2022-01-01 NOTE — Discharge Instructions (Addendum)
Follow up with PCP Go to ER if you have ANY worsening symptoms. Take medication as prescribed

## 2022-01-01 NOTE — ED Provider Notes (Signed)
[facility]    CSN: [id number] Arrival date & time: 01/01/22  1117      History   Chief Complaint Chief Complaint  Patient presents with   Hypertension         HPI Danielle Simmons is a 36 y.o. female.   Patient here concerned with chest pain x this morning (~ 6 hours).  She reports history of HTN.  She states she's had a HA as well.  Pain is centrally located, pressure like.  Denies radiation.  She has not taking anything for pain.     Past Medical History:  Diagnosis Date   Beta thalassemia (HCC)    Bronchitis    Headache    during pregnancy, Tylenol helped   Hypertension    No pertinent past medical history     Patient Active Problem List   Diagnosis Date Noted   Status post normal vaginal delivery 10/15/2014   Postpartum hemorrhage 10/15/2014   Large for gestational age (LGA) 10/14/2014    Past Surgical History:  Procedure Laterality Date   NO PAST SURGERIES     prolapsed uterus      OB History     Gravida  4   Para  3   Term  3   Preterm  0   AB  0   Living  3      SAB  0   IAB  0   Ectopic  0   Multiple      Live Births  3            Home Medications    Prior to Admission medications   Medication Sig Start Date End Date Taking? Authorizing Provider  candesartan-hydrochlorothiazide (ATACAND HCT) 16-12.5 MG tablet Take 1 tablet by mouth daily.   Yes [provider]  acetaminophen (TYLENOL) 500 MG tablet Take 1,000 mg by mouth every 6 (six) hours as needed for headache.    [provider]  albuterol (PROVENTIL HFA;VENTOLIN HFA) 108 (90 BASE) MCG/ACT inhaler Inhale 1 puff into the lungs every 6 (six) hours as needed for wheezing or shortness of breath.    [provider]  docusate sodium (COLACE) 100 MG capsule Take 1 capsule (100 mg total) by mouth 2 (two) times daily. 10/16/14   Jerelyn Charles, MD  ferrous sulfate 325 (65 FE) MG tablet Take 1 tablet (325 mg total) by mouth 2 (two)  times daily with a meal. 10/16/14   Jerelyn Charles, MD  ibuprofen (ADVIL,MOTRIN) 600 MG tablet Take 1 tablet (600 mg total) by mouth every 6 (six) hours. 10/16/14   Jerelyn Charles, MD  Multiple Vitamin (MULTIVITAMIN WITH MINERALS) TABS tablet Take 1 tablet by mouth daily.    [provider]  oxyCODONE-acetaminophen (PERCOCET/ROXICET) 5-325 MG per tablet Take 1 tablet by mouth every 6 (six) hours as needed (for pain scale less than 7). Patient not taking: Reported on 01/01/2022 10/16/14   Jerelyn Charles, MD  Prenatal Vit-Fe Fumarate-FA (PRENATAL MULTIVITAMIN) TABS tablet Take 1 tablet by mouth daily at 12 noon.    [provider]    Family History Family History  Problem Relation Age of Onset   Hypertension Mother    Thyroid disease Sister     Social History Social History   Tobacco Use   Smoking status: Never  Substance Use Topics   Alcohol use: Not Currently   Drug use: Not Currently     Allergies   Banana   Review of Systems Review  of Systems  Constitutional:  Negative for activity change, appetite change, diaphoresis, fatigue and fever.  Eyes:  Negative for photophobia and visual disturbance.  Respiratory:  Negative for cough, chest tightness, shortness of breath and wheezing.   Cardiovascular:  Positive for chest pain. Negative for palpitations and leg swelling.  Gastrointestinal:  Negative for nausea and vomiting.  Musculoskeletal:  Negative for myalgias.  Neurological:  Positive for headaches. Negative for dizziness, syncope, facial asymmetry, weakness, light-headedness and numbness.  Psychiatric/Behavioral:  Negative for confusion and sleep disturbance.     Physical Exam Triage Vital Signs ED Triage Vitals  Enc Vitals Group     BP 01/01/22 1146 (!) 142/97     Pulse Rate 01/01/22 1146 83     Resp 01/01/22 1146 18     Temp 01/01/22 1146 98.3 F (36.8 C)     Temp Source 01/01/22 1146 Oral     SpO2 01/01/22 1146 98 %     Weight --      Height --       Head Circumference --      Peak Flow --      Pain Score 01/01/22 1147 4     Pain Loc --      Pain Edu? --      Excl. in GC? --    No data found.  Updated Vital Signs BP 131/88 (BP Location: Left Arm)    Pulse 83    Temp 98.3 F (36.8 C) (Oral)    Resp 18    SpO2 98%   Visual Acuity Right Eye Distance:   Left Eye Distance:   Bilateral Distance:    Right Eye Near:   Left Eye Near:    Bilateral Near:     Physical Exam Vitals and nursing note reviewed.  Constitutional:      General: She is not in acute distress.    Appearance: Normal appearance. She is not ill-appearing.  HENT:     Head: Normocephalic and atraumatic.  Eyes:     General: No scleral icterus.    Extraocular Movements: Extraocular movements intact.     Conjunctiva/sclera: Conjunctivae normal.  Cardiovascular:     Rate and Rhythm: Normal rate and regular rhythm.     Heart sounds: No murmur heard. Pulmonary:     Effort: Pulmonary effort is normal. No respiratory distress.     Breath sounds: Normal breath sounds. No wheezing or rales.  Chest:     Chest wall: No tenderness.  Musculoskeletal:     Cervical back: Normal range of motion. No rigidity.     Right lower leg: No edema.     Left lower leg: No edema.  Skin:    Capillary Refill: Capillary refill takes less than 2 seconds.     Coloration: Skin is not jaundiced.     Findings: No rash.  Neurological:     General: No focal deficit present.     Mental Status: She is alert and oriented to person, place, and time.     GCS: GCS eye subscore is 4. GCS verbal subscore is 5. GCS motor subscore is 6.     Cranial Nerves: Cranial nerves 2-12 are intact.     Motor: Motor function is intact. No weakness, tremor or abnormal muscle tone.     Gait: Gait normal.     Deep Tendon Reflexes:     Reflex Scores:      Patellar reflexes are 2+ on the right side and 2+ on the left  side. Psychiatric:        Mood and Affect: Mood normal.        Behavior: Behavior normal.      UC Treatments / Results  Labs (all labs ordered are listed, but only abnormal results are displayed) Labs Reviewed - No data to display  EKG EKG: unchanged from previous tracings, normal sinus rhythm, nonspecific T waves changes.   Radiology No results found.  Procedures Procedures (including critical care time)  Medications Ordered in UC Medications - No data to display  Initial Impression / Assessment and Plan / UC Course  I have reviewed the triage vital signs and the nursing notes.  Pertinent labs & imaging results that were available during my care of the patient were reviewed by me and considered in my medical decision making (see chart for details).     Discussed with patient that I cannot exclude MI or PE - patient declined to go to ED Unable to use [facility] rules - she is on hormonal IUD  She reports she has appointment with PCP on Monday - follow up with PCP Strict ED precautions provided Patient will go to ED if sx worsen or fail to improve Final Clinical Impressions(s) / UC Diagnoses   Final diagnoses:  Chest pain, unspecified type  Hypertension, unspecified type  Acute nonintractable headache, unspecified headache type     Discharge Instructions      Follow up with PCP Go to ER if you have ANY worsening symptoms. Take medication as prescribed    ED Prescriptions   None    PDMP not reviewed this encounter.   Peri Jefferson, PA-C 01/01/22 1227

## 2022-01-03 DIAGNOSIS — R110 Nausea: Secondary | ICD-10-CM | POA: Diagnosis not present

## 2022-01-03 DIAGNOSIS — R0789 Other chest pain: Secondary | ICD-10-CM | POA: Diagnosis not present

## 2022-01-03 DIAGNOSIS — I10 Essential (primary) hypertension: Secondary | ICD-10-CM | POA: Diagnosis not present

## 2022-01-03 DIAGNOSIS — K7581 Nonalcoholic steatohepatitis (NASH): Secondary | ICD-10-CM | POA: Diagnosis not present

## 2022-01-10 ENCOUNTER — Telehealth: Payer: Self-pay | Admitting: Physician Assistant

## 2022-01-10 NOTE — Telephone Encounter (Signed)
Scheduled appt per 1/12 referral. Pt is aware of appt date and time. Pt is aware to arrive 15 mins prior to appt time.  °

## 2022-01-24 NOTE — Progress Notes (Signed)
[place] [phone]   Fax:([phone]) [place] NOTE  Patient Care Team: Leeroy Cha, MD as PCP - General (Internal Medicine) Patient, No Pcp Per (Inactive) (General Practice)  Hematological/Oncological History 1) Labs from PCP, Dr. Leeroy Cha at [facility] -07/14/2020: Ferritin 47.4 -07/27/2020: WBC 6.2, RBC 6.09 (H), Hgb 12.6, MCV 64.2 (L), Plt 207 -05/11/2021: WBC 6.8., RBC 6.00 (H), Hgb 12.2, MCV 63.7 (L), Plt 211  2) 01/25/2022: Establish care with [facility]  CHIEF COMPLAINTS/PURPOSE OF CONSULTATION:  Erythrocytosis and microcytosis  HISTORY OF PRESENTING ILLNESS:  Danielle Simmons 36 y.o. female with medical history significant for hypertension, fatty liver and beta thalassemia trait.  She presents to the clinic to establish care for microcytosis and erythrocytosis.  On exam today, Ms. Abdulla reports that her energy levels are fairly stable.  She is able to complete all her daily activities on her own.  She has a good appetite without any noticeable weight changes.  Patient denies any nausea, vomiting or abdominal pain.  Her bowel habits are regular without any diarrhea or constipation.  She denies easy bruising or signs of active bleeding.  She denies any fevers, chills, night sweats, shortness of breath, chest pain or cough.  She has no other complaints.  Remaining 10 point ROS is below.  MEDICAL HISTORY:  Past Medical History:  Diagnosis Date   Beta thalassemia (HCC)    Bronchitis    Fatty liver    Headache    during pregnancy, Tylenol helped   Hypertension    No pertinent past medical history     SURGICAL HISTORY: Past Surgical History:  Procedure Laterality Date   NO PAST SURGERIES     prolapsed uterus  2018    SOCIAL HISTORY: Social History   Socioeconomic History   Marital status: Married    Spouse name: Not on file   Number of children: Not on file   Years of education: Not on file    Highest education level: Not on file  Occupational History   Not on file  Tobacco Use   Smoking status: Never   Smokeless tobacco: Never  Substance and Sexual Activity   Alcohol use: Not Currently   Drug use: Not Currently   Sexual activity: Yes  Other Topics Concern   Not on file  Social History Narrative   ** Merged History Encounter **       Social Determinants of Health   Financial Resource Strain: Not on file  Food Insecurity: Not on file  Transportation Needs: Not on file  Physical Activity: Not on file  Stress: Not on file  Social Connections: Not on file  Intimate Partner Violence: Not on file    FAMILY HISTORY: Family History  Problem Relation Age of Onset   Hypertension Mother    Thyroid disease Sister     ALLERGIES:  is allergic to banana.  MEDICATIONS:  Current Outpatient Medications  Medication Sig Dispense Refill   acetaminophen (TYLENOL) 500 MG tablet Take 1,000 mg by mouth every 6 (six) hours as needed for headache.     candesartan-hydrochlorothiazide (ATACAND HCT) 16-12.5 MG tablet Take 1 tablet by mouth daily.     Milk Thistle 200 MG CAPS Take by mouth daily.     albuterol (PROVENTIL HFA;VENTOLIN HFA) 108 (90 BASE) MCG/ACT inhaler Inhale 1 puff into the lungs every 6 (six) hours as needed for wheezing or shortness of breath. (Patient not taking: Reported on 01/25/2022)     docusate sodium (  COLACE) 100 MG capsule Take 1 capsule (100 mg total) by mouth 2 (two) times daily. (Patient not taking: Reported on 01/25/2022) 60 capsule 2   ferrous sulfate 325 (65 FE) MG tablet Take 1 tablet (325 mg total) by mouth 2 (two) times daily with a meal. (Patient not taking: Reported on 01/25/2022) 60 tablet 3   ibuprofen (ADVIL,MOTRIN) 600 MG tablet Take 1 tablet (600 mg total) by mouth every 6 (six) hours. (Patient not taking: Reported on 01/25/2022) 40 tablet 0   Multiple Vitamin (MULTIVITAMIN WITH MINERALS) TABS tablet Take 1 tablet by mouth daily. (Patient not taking:  Reported on 01/25/2022)     oxyCODONE-acetaminophen (PERCOCET/ROXICET) 5-325 MG per tablet Take 1 tablet by mouth every 6 (six) hours as needed (for pain scale less than 7). (Patient not taking: Reported on 01/25/2022) 20 tablet 0   Prenatal Vit-Fe Fumarate-FA (PRENATAL MULTIVITAMIN) TABS tablet Take 1 tablet by mouth daily at 12 noon. (Patient not taking: Reported on 01/25/2022)     No current facility-administered medications for this visit.    REVIEW OF SYSTEMS:   Constitutional: ( - ) fevers, ( - )  chills , ( - ) night sweats Eyes: ( - ) blurriness of vision, ( - ) double vision, ( - ) watery eyes Ears, nose, mouth, throat, and face: ( - ) mucositis, ( - ) sore throat Respiratory: ( - ) cough, ( - ) dyspnea, ( - ) wheezes Cardiovascular: ( - ) palpitation, ( - ) chest discomfort, ( - ) lower extremity swelling Gastrointestinal:  ( - ) nausea, ( - ) heartburn, ( - ) change in bowel habits Skin: ( - ) abnormal skin rashes Lymphatics: ( - ) new lymphadenopathy, ( - ) easy bruising Neurological: ( - ) numbness, ( - ) tingling, ( - ) new weaknesses Behavioral/Psych: ( - ) mood change, ( - ) new changes  All other systems were reviewed with the patient and are negative.  PHYSICAL EXAMINATION: ECOG PERFORMANCE STATUS: 0 - Asymptomatic  Vitals:   01/25/22 0852  BP: 130/81  Pulse: 78  Resp: 20  Temp: 97.9 F (36.6 C)  SpO2: 100%   Filed Weights   01/25/22 0852  Weight: 194 lb 8 oz (88.2 kg)    GENERAL: well appearing female in NAD  SKIN: skin color, texture, turgor are normal, no rashes or significant lesions EYES: conjunctiva are pink and non-injected, sclera clear OROPHARYNX: no exudate, no erythema; lips, buccal mucosa, and tongue normal  NECK: supple, non-tender LYMPH:  no palpable lymphadenopathy in the cervical or supraclavicular lymph nodes.  LUNGS: clear to auscultation and percussion with normal breathing effort HEART: regular rate & rhythm and no murmurs and no lower  extremity edema ABDOMEN: soft, non-tender, non-distended, normal bowel sounds Musculoskeletal: no cyanosis of digits and no clubbing  PSYCH: alert & oriented x 3, fluent speech NEURO: no focal motor/sensory deficits  LABORATORY DATA:  I have reviewed the data as listed CBC Latest Ref Rng & Units 01/25/2022 07/08/2018 10/16/2014  WBC 4.0 - 10.5 K/uL 6.2 7.7 6.9  Hemoglobin 12.0 - 15.0 g/dL 12.9 12.0 6.9(LL)  Hematocrit 36.0 - 46.0 % 40.5 39.0 21.8(L)  Platelets 150 - 400 K/uL 198 207 143(L)    CMP Latest Ref Rng & Units 07/08/2018  Glucose 70 - 99 mg/dL 94  BUN 6 - 20 mg/dL 11  Creatinine 0.44 - 1.00 mg/dL 0.49  Sodium 135 - 145 mmol/L 137  Potassium 3.5 - 5.1 mmol/L 3.8  Chloride 98 -  111 mmol/L 103  CO2 22 - 32 mmol/L 26  Calcium 8.9 - 10.3 mg/dL 9.8    ASSESSMENT & PLAN Hoang Phiona Olliver is a 36 y.o. female who presents to the clinic for initial evaluation for microcytosis and erythrocytosis.   The likely cause of the above lab findings is due to patient's reported history of beta thalassemia trait. [place] so we will proceed with hemoglobin electrophoresis to confirm this. I reassured the patient that beta thalassemia is an inheritable condition that is managed with supportive care only.  Generally, microcytosis should not affect her long-term health especially since she has no evidence of anemia.  Patient will proceed with serologic evaluation to confirm thalassemia trait and rule out iron deficiency.  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 Will obtain hemoglobin electrophoresis to confirm this. --Labs today to check CBC, CMP, erythropoietin and iron panel. --RTC only if above workup requires further intervention.    Orders Placed This Encounter  Procedures   CBC with Differential ([facility] Only)    Standing Status:   Future    Number of Occurrences:   1    Standing Expiration Date:    01/24/2023   CMP ([facility] only)    Standing Status:   Future    Number of Occurrences:   1    Standing Expiration Date:   01/24/2023   Erythropoietin    Standing Status:   Future    Number of Occurrences:   1    Standing Expiration Date:   01/24/2023   Hgb Fractionation Cascade    Standing Status:   Future    Number of Occurrences:   1    Standing Expiration Date:   01/24/2023   Ferritin    Standing Status:   Future    Number of Occurrences:   1    Standing Expiration Date:   01/25/2023   Iron and Iron Binding Capacity ([facility] only)    Standing Status:   Future    Number of Occurrences:   1    Standing Expiration Date:   01/25/2023   Retic Panel    Standing Status:   Future    Number of Occurrences:   1    Standing Expiration Date:   01/25/2023    All questions were answered. The patient knows to call the clinic with any problems, questions or concerns.  I have spent a total of 60 minutes minutes of face-to-face and non-face-to-face time, preparing to see the patient, obtaining and/or reviewing separately obtained history, performing a medically appropriate examination, counseling and educating the patient, ordering medications/tests/procedures, documenting clinical information in the electronic health record, iand care coordination.   Dede Query, PA-C Department of Hematology/Oncology [place] at [facility] Phone: [phone]  Patient was seen with Dr. Lorenso Courier  I have read the above note and personally examined the patient. I agree with the assessment and plan as noted above.  Briefly Mr. Chin is a 36 year old female who presents for evaluation of microcytosis and reported history of thalassemia. She noted she [place]. Would recommend Hgb Fractionation cascade to confirm diagnosis as well as iron studies to assure she does not have coinciding iron deficiency. If not iron deficient, no need for return to clinic.     Ledell Peoples, MD Department of Hematology/Oncology [place] at [facility] Phone: [phone] Pager: [phone] Email: [email or web address]

## 2022-01-25 ENCOUNTER — Inpatient Hospital Stay: Payer: BC Managed Care – PPO

## 2022-01-25 ENCOUNTER — Inpatient Hospital Stay: Payer: BC Managed Care – PPO | Attending: Physician Assistant | Admitting: Physician Assistant

## 2022-01-25 ENCOUNTER — Other Ambulatory Visit: Payer: Self-pay

## 2022-01-25 ENCOUNTER — Encounter: Payer: Self-pay | Admitting: Physician Assistant

## 2022-01-25 VITALS — BP 130/81 | HR 78 | Temp 97.9°F | Resp 20 | Wt 194.5 lb

## 2022-01-25 DIAGNOSIS — Z79899 Other long term (current) drug therapy: Secondary | ICD-10-CM | POA: Diagnosis not present

## 2022-01-25 DIAGNOSIS — D751 Secondary polycythemia: Secondary | ICD-10-CM | POA: Diagnosis not present

## 2022-01-25 DIAGNOSIS — R718 Other abnormality of red blood cells: Secondary | ICD-10-CM

## 2022-01-25 LAB — CMP (CANCER CENTER ONLY)
ALT: 73 U/L — ABNORMAL HIGH (ref 0–44)
AST: 39 U/L (ref 15–41)
Albumin: 4.5 g/dL (ref 3.5–5.0)
Alkaline Phosphatase: 54 U/L (ref 38–126)
Anion gap: 6 (ref 5–15)
BUN: 21 mg/dL — ABNORMAL HIGH (ref 6–20)
CO2: 26 mmol/L (ref 22–32)
Calcium: 9.5 mg/dL (ref 8.9–10.3)
Chloride: 104 mmol/L (ref 98–111)
Creatinine: 0.76 mg/dL (ref 0.44–1.00)
GFR, Estimated: >60 mL/min (ref >60)
Glucose, Bld: 102 mg/dL — ABNORMAL HIGH (ref 70–99)
Potassium: 4.3 mmol/L (ref 3.5–5.1)
Sodium: 136 mmol/L (ref 135–145)
Total Bilirubin: 0.4 mg/dL (ref 0.3–1.2)
Total Protein: 8.4 g/dL — ABNORMAL HIGH (ref 6.5–8.1)

## 2022-01-25 LAB — IRON AND IRON BINDING CAPACITY (CC-WL,HP ONLY)
Iron: 36 ug/dL (ref 28–170)
Saturation Ratios: 7 % — ABNORMAL LOW (ref 10.4–31.8)
TIBC: 546 ug/dL — ABNORMAL HIGH (ref 250–450)
UIBC: 510 ug/dL — ABNORMAL HIGH (ref 148–442)

## 2022-01-25 LAB — CBC WITH DIFFERENTIAL (CANCER CENTER ONLY)
Abs Immature Granulocytes: 0.01 10*3/uL (ref 0.00–0.07)
Basophils Absolute: 0.1 10*3/uL (ref 0.0–0.1)
Basophils Relative: 1 %
Eosinophils Absolute: 0.1 10*3/uL (ref 0.0–0.5)
Eosinophils Relative: 1 %
HCT: 40.5 % (ref 36.0–46.0)
Hemoglobin: 12.9 g/dL (ref 12.0–15.0)
Immature Granulocytes: 0 %
Lymphocytes Relative: 42 %
Lymphs Abs: 2.6 10*3/uL (ref 0.7–4.0)
MCH: 20.2 pg — ABNORMAL LOW (ref 26.0–34.0)
MCHC: 31.9 g/dL (ref 30.0–36.0)
MCV: 63.4 fL — ABNORMAL LOW (ref 80.0–100.0)
Monocytes Absolute: 0.4 10*3/uL (ref 0.1–1.0)
Monocytes Relative: 7 %
Neutro Abs: 3.1 10*3/uL (ref 1.7–7.7)
Neutrophils Relative %: 49 %
Platelet Count: 198 10*3/uL (ref 150–400)
RBC: 6.39 MIL/uL — ABNORMAL HIGH (ref 3.87–5.11)
RDW: 17.3 % — ABNORMAL HIGH (ref 11.5–15.5)
WBC Count: 6.2 10*3/uL (ref 4.0–10.5)
nRBC: 0 % (ref 0.0–0.2)

## 2022-01-25 LAB — FERRITIN: Ferritin: 45 ng/mL (ref 11–307)

## 2022-01-25 LAB — RETIC PANEL
Immature Retic Fract: 20.5 % — ABNORMAL HIGH (ref 2.3–15.9)
RBC.: 6.26 MIL/uL — ABNORMAL HIGH (ref 3.87–5.11)
Retic Count, Absolute: 78.9 10*3/uL (ref 19.0–186.0)
Retic Ct Pct: 1.3 % (ref 0.4–3.1)
Reticulocyte Hemoglobin: 23.7 pg — ABNORMAL LOW (ref >27.9)

## 2022-01-27 LAB — HGB FRACTIONATION CASCADE
Hgb A2: 5 % — ABNORMAL HIGH (ref 1.8–3.2)
Hgb A: 91.6 % — ABNORMAL LOW (ref 96.4–98.8)
Hgb F: 3.4 % — ABNORMAL HIGH (ref 0.0–2.0)
Hgb S: 0 %

## 2022-01-27 LAB — ERYTHROPOIETIN: Erythropoietin: 10.5 m[IU]/mL (ref 2.6–18.5)

## 2022-01-28 ENCOUNTER — Encounter: Payer: Self-pay | Admitting: Physician Assistant

## 2022-01-31 ENCOUNTER — Other Ambulatory Visit: Payer: Self-pay | Admitting: Physician Assistant

## 2022-01-31 MED ORDER — FERROUS SULFATE 325 (65 FE) MG PO TABS: 325.0000 mg | ORAL_TABLET | Freq: Every day | ORAL | 3 refills | Status: DC

## 2022-01-31 NOTE — Telephone Encounter (Signed)
PCP notified and labs faxed to R. Chales Salmon, MD  [phone]. Confirmation received.

## 2022-01-31 NOTE — Telephone Encounter (Signed)
-----   Message from Briant Cedar, PA-C sent at 01/28/2022  4:55 PM EST ----- Hi Ladies,   Mollie: Please schedule labs and follow up in 3 months   Cleatus Gabriel: Please contact patient's PCP and notify that her liver enzyme was slightly elevated when we did our labs. Please fax lab results and request a follow up to repeat levels in 4 weeks to monitor.  Thanks, Karena Addison

## 2022-04-11 ENCOUNTER — Telehealth: Payer: Self-pay | Admitting: Physician Assistant

## 2022-04-11 NOTE — Telephone Encounter (Signed)
R/s per provider pal, message has been left with pt ?

## 2022-05-03 ENCOUNTER — Ambulatory Visit: Payer: BC Managed Care – PPO | Admitting: Physician Assistant

## 2022-05-03 ENCOUNTER — Other Ambulatory Visit: Payer: Self-pay | Admitting: Physician Assistant

## 2022-05-03 ENCOUNTER — Other Ambulatory Visit: Payer: BC Managed Care – PPO

## 2022-05-03 DIAGNOSIS — D508 Other iron deficiency anemias: Secondary | ICD-10-CM

## 2022-05-03 DIAGNOSIS — R718 Other abnormality of red blood cells: Secondary | ICD-10-CM

## 2022-05-04 ENCOUNTER — Inpatient Hospital Stay: Payer: BC Managed Care – PPO | Admitting: Physician Assistant

## 2022-05-04 ENCOUNTER — Inpatient Hospital Stay: Payer: BC Managed Care – PPO | Attending: Physician Assistant

## 2022-05-04 ENCOUNTER — Other Ambulatory Visit: Payer: Self-pay

## 2022-05-04 ENCOUNTER — Telehealth: Payer: Self-pay

## 2022-05-04 VITALS — BP 131/92 | HR 81 | Temp 97.9°F | Resp 20 | Wt 186.3 lb

## 2022-05-04 DIAGNOSIS — D563 Thalassemia minor: Secondary | ICD-10-CM | POA: Insufficient documentation

## 2022-05-04 DIAGNOSIS — R718 Other abnormality of red blood cells: Secondary | ICD-10-CM

## 2022-05-04 DIAGNOSIS — E611 Iron deficiency: Secondary | ICD-10-CM

## 2022-05-04 DIAGNOSIS — R7989 Other specified abnormal findings of blood chemistry: Secondary | ICD-10-CM | POA: Insufficient documentation

## 2022-05-04 DIAGNOSIS — K760 Fatty (change of) liver, not elsewhere classified: Secondary | ICD-10-CM | POA: Insufficient documentation

## 2022-05-04 DIAGNOSIS — D508 Other iron deficiency anemias: Secondary | ICD-10-CM

## 2022-05-04 LAB — CBC WITH DIFFERENTIAL (CANCER CENTER ONLY)
Abs Immature Granulocytes: 0.02 10*3/uL (ref 0.00–0.07)
Basophils Absolute: 0 10*3/uL (ref 0.0–0.1)
Basophils Relative: 1 %
Eosinophils Absolute: 0.1 10*3/uL (ref 0.0–0.5)
Eosinophils Relative: 1 %
HCT: 40.3 % (ref 36.0–46.0)
Hemoglobin: 12.8 g/dL (ref 12.0–15.0)
Immature Granulocytes: 0 %
Lymphocytes Relative: 37 %
Lymphs Abs: 2.7 10*3/uL (ref 0.7–4.0)
MCH: 19.8 pg — ABNORMAL LOW (ref 26.0–34.0)
MCHC: 31.8 g/dL (ref 30.0–36.0)
MCV: 62.4 fL — ABNORMAL LOW (ref 80.0–100.0)
Monocytes Absolute: 0.5 10*3/uL (ref 0.1–1.0)
Monocytes Relative: 7 %
Neutro Abs: 4 10*3/uL (ref 1.7–7.7)
Neutrophils Relative %: 54 %
Platelet Count: 188 10*3/uL (ref 150–400)
RBC: 6.46 MIL/uL — ABNORMAL HIGH (ref 3.87–5.11)
RDW: 18.6 % — ABNORMAL HIGH (ref 11.5–15.5)
WBC Count: 7.3 10*3/uL (ref 4.0–10.5)
nRBC: 0 % (ref 0.0–0.2)

## 2022-05-04 LAB — IRON AND IRON BINDING CAPACITY (CC-WL,HP ONLY)
Iron: 99 ug/dL (ref 28–170)
Saturation Ratios: 19 % (ref 10.4–31.8)
TIBC: 518 ug/dL — ABNORMAL HIGH (ref 250–450)
UIBC: 419 ug/dL (ref 148–442)

## 2022-05-04 LAB — CMP (CANCER CENTER ONLY)
ALT: 48 U/L — ABNORMAL HIGH (ref 0–44)
AST: 33 U/L (ref 15–41)
Albumin: 4.2 g/dL (ref 3.5–5.0)
Alkaline Phosphatase: 53 U/L (ref 38–126)
Anion gap: 6 (ref 5–15)
BUN: 11 mg/dL (ref 6–20)
CO2: 23 mmol/L (ref 22–32)
Calcium: 9.2 mg/dL (ref 8.9–10.3)
Chloride: 107 mmol/L (ref 98–111)
Creatinine: 0.5 mg/dL (ref 0.44–1.00)
GFR, Estimated: >60 mL/min (ref >60)
Glucose, Bld: 114 mg/dL — ABNORMAL HIGH (ref 70–99)
Potassium: 4.1 mmol/L (ref 3.5–5.1)
Sodium: 136 mmol/L (ref 135–145)
Total Bilirubin: 0.7 mg/dL (ref 0.3–1.2)
Total Protein: 8.2 g/dL — ABNORMAL HIGH (ref 6.5–8.1)

## 2022-05-04 LAB — FERRITIN: Ferritin: 20 ng/mL (ref 11–307)

## 2022-05-04 MED ORDER — FERROUS SULFATE 325 (65 FE) MG PO TABS: 325.0000 mg | ORAL_TABLET | Freq: Every day | ORAL | 3 refills | Status: AC

## 2022-05-04 NOTE — Progress Notes (Signed)
?[place] ?[phone]   Fax:([phone]) [phone] ? ?PROGRESS NOTE ? ?Patient Care Team: ?Leeroy Cha, MD as PCP - General (Internal Medicine) ?Patient, No Pcp Per (Inactive) (General Practice) ? ?Hematological/Oncological History ?1) Labs from PCP, Dr. Leeroy Cha at [facility] ?-07/14/2020: Ferritin 47.4 ?-07/27/2020: WBC 6.2, RBC 6.09 (H), Hgb 12.6, MCV 64.2 (L), Plt 207 ?-05/11/2021: WBC 6.8., RBC 6.00 (H), Hgb 12.2, MCV 63.7 (L), Plt 211 ? ?2) 01/25/2022: Establish care with [facility]ematology ? ?CHIEF COMPLAINTS/PURPOSE OF CONSULTATION:  ?-Iron deficiency ?-Beta-thalassemia minor ? ?HISTORY OF PRESENTING ILLNESS:  ?Danielle Simmons 36 y.o. female returns for a follow up for iron deficiency. She was last seen on 01/25/2022 and denies any changes to her health.  ? ?On exam today, Danielle Simmons reports that she reports improvement of energy levels as she is eating healthy and trying to be more active. She has lost approximately 8 lbs since January 2023. She denies any nausea, vomiting or abdominal pain. Her bowel habits are  unchanged without any recurrent episodes of diarrhea or constipation. She denies easy bruising or signs of active bleeding.  She denies any fevers, chills, night sweats, shortness of breath, chest pain or cough.  She has no other complaints.  Remaining 10 point ROS is below. ? ?MEDICAL HISTORY:  ?Past Medical History:  ?Diagnosis Date  ? Beta thalassemia (HCC)   ? Bronchitis   ? Fatty liver   ? Headache   ? during pregnancy, Tylenol helped  ? Hypertension   ? No pertinent past medical history   ? ? ?SURGICAL HISTORY: ?Past Surgical History:  ?Procedure Laterality Date  ? NO PAST SURGERIES    ? prolapsed uterus  2018  ? ? ?SOCIAL HISTORY: ?Social History  ? ?Socioeconomic History  ? Marital status: Married  ?  Spouse name: Not on file  ? Number of children: Not on file  ? Years of education: Not on file  ? Highest education level: Not on file  ?Occupational  History  ? Not on file  ?Tobacco Use  ? Smoking status: Never  ? Smokeless tobacco: Never  ?Substance and Sexual Activity  ? Alcohol use: Not Currently  ? Drug use: Not Currently  ? Sexual activity: Yes  ?Other Topics Concern  ? Not on file  ?Social History Narrative  ? ** Merged History Encounter **  ?    ? ?Social Determinants of Health  ? ?Financial Resource Strain: Not on file  ?Food Insecurity: Not on file  ?Transportation Needs: Not on file  ?Physical Activity: Not on file  ?Stress: Not on file  ?Social Connections: Not on file  ?Intimate Partner Violence: Not on file  ? ? ?FAMILY HISTORY: ?Family History  ?Problem Relation Age of Onset  ? Hypertension Mother   ? Thyroid disease Sister   ? ? ?ALLERGIES:  is allergic to banana. ? ?MEDICATIONS:  ?Current Outpatient Medications  ?Medication Sig Dispense Refill  ? acetaminophen (TYLENOL) 500 MG tablet Take 1,000 mg by mouth every 6 (six) hours as needed for headache.    ? Milk Thistle 200 MG CAPS Take by mouth daily.    ? ferrous sulfate 325 (65 FE) MG tablet Take 1 tablet (325 mg total) by mouth daily with breakfast. 30 tablet 3  ? ?No current facility-administered medications for this visit.  ? ? ?REVIEW OF SYSTEMS:   ?Constitutional: ( - ) fevers, ( - )  chills , ( - ) night sweats ?Eyes: ( - ) blurriness of vision, ( - )  double vision, ( - ) watery eyes ?Ears, nose, mouth, throat, and face: ( - ) mucositis, ( - ) sore throat ?Respiratory: ( - ) cough, ( - ) dyspnea, ( - ) wheezes ?Cardiovascular: ( - ) palpitation, ( - ) chest discomfort, ( - ) lower extremity swelling ?Gastrointestinal:  ( - ) nausea, ( - ) heartburn, ( - ) change in bowel habits ?Skin: ( - ) abnormal skin rashes ?Lymphatics: ( - ) new lymphadenopathy, ( - ) easy bruising ?Neurological: ( - ) numbness, ( - ) tingling, ( - ) new weaknesses ?Behavioral/Psych: ( - ) mood change, ( - ) new changes  ?All other systems were reviewed with the patient and are negative. ? ?PHYSICAL EXAMINATION: ?ECOG  PERFORMANCE STATUS: 0 - Asymptomatic ? ?Vitals:  ? 05/04/22 0927  ?BP: (!) 131/92  ?Pulse: 81  ?Resp: 20  ?Temp: 97.9 ?F (36.6 ?C)  ?SpO2: 99%  ? ?Filed Weights  ? 05/04/22 0927  ?Weight: 186 lb 4.8 oz (84.5 kg)  ? ? ?GENERAL: well appearing female in NAD  ?SKIN: skin color, texture, turgor are normal, no rashes or significant lesions ?EYES: conjunctiva are pink and non-injected, sclera clear ?LUNGS: clear to auscultation and percussion with normal breathing effort ?HEART: regular rate & rhythm and no murmurs and no lower extremity edema ?Musculoskeletal: no cyanosis of digits and no clubbing  ?PSYCH: alert & oriented x 3, fluent speech ?NEURO: no focal motor/sensory deficits ? ?LABORATORY DATA:  ?I have reviewed the data as listed ? ?  Latest Ref Rng & Units 05/04/2022  ?  9:17 AM 01/25/2022  ? 10:04 AM 07/08/2018  ?  3:49 PM  ?CBC  ?WBC 4.0 - 10.5 K/uL 7.3   6.2   7.7    ?Hemoglobin 12.0 - 15.0 g/dL 12.8   12.9   12.0    ?Hematocrit 36.0 - 46.0 % 40.3   40.5   39.0    ?Platelets 150 - 400 K/uL 188   198   207    ? ? ? ?  Latest Ref Rng & Units 05/04/2022  ?  9:17 AM 01/25/2022  ? 10:04 AM 07/08/2018  ?  3:49 PM  ?CMP  ?Glucose 70 - 99 mg/dL 114   102   94    ?BUN 6 - 20 mg/dL 11   21   11     ?Creatinine 0.44 - 1.00 mg/dL 0.50   0.76   0.49    ?Sodium 135 - 145 mmol/L 136   136   137    ?Potassium 3.5 - 5.1 mmol/L 4.1   4.3   3.8    ?Chloride 98 - 111 mmol/L 107   104   103    ?CO2 22 - 32 mmol/L 23   26   26     ?Calcium 8.9 - 10.3 mg/dL 9.2   9.5   9.8    ?Total Protein 6.5 - 8.1 g/dL 8.2   8.4     ?Total Bilirubin 0.3 - 1.2 mg/dL 0.7   0.4     ?Alkaline Phos 38 - 126 U/L 53   54     ?AST 15 - 41 U/L 33   39     ?ALT 0 - 44 U/L 48   73     ? ? ?ASSESSMENT & PLAN ?Danielle Simmons is a 36 y.o. female who returns for a follow up for iron deficiency and beta-thalassemia minor.  ? ?#Iron deficiency without anemia: ?--Patient was not  able to pick up the prescription that was sent during last visit.  ?--Labs today show  borderline deficiency with ferritin 20, elevated TIBC and saturation 19%.  ?--Recommend to take ferrous sulfate 325 mg once a day with a source of vitamin C.  ?--Encouraged to eat iron rich foods.  ?--RTC in 6 months with labs.  ? 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  ?--No intervention at this time.  ? ?#Elevated ALT levels: ?--Likely secondary to fatty liver ?--Levels have improved today to 48 U/L as patient has been working on diet modifications.  ?--Continue to closely monitor.  ? ? ?No orders of the defined types were placed in this encounter. ? ? ?All questions were answered. The patient knows to call the clinic with any problems, questions or concerns. ? ?I have spent a total of 30 minutes minutes of face-to-face and non-face-to-face time, preparing to see the patient,  performing a medically appropriate examination, counseling and educating the patient, ordering medications/tests/procedures, documenting clinical information in the electronic health record, and care coordination.  ? ?Dede Query, PA-C ?Department of Hematology/Oncology ?[place] at [facility] ?Phone: [phone] ? ? ? ?

## 2022-05-04 NOTE — Telephone Encounter (Signed)
Please notify patient that she has borderline low levels of iron deficiency so recommend to start iron tablets. I sent prescription to [facility] on file.  ?We will see her back in 6 months to repeat labs ? ?LM for pt regarding lab results and rx sent to [facility] ?

## 2022-06-09 DIAGNOSIS — H5213 Myopia, bilateral: Secondary | ICD-10-CM | POA: Diagnosis not present

## 2022-08-03 DIAGNOSIS — K7581 Nonalcoholic steatohepatitis (NASH): Secondary | ICD-10-CM | POA: Diagnosis not present

## 2022-08-03 DIAGNOSIS — D500 Iron deficiency anemia secondary to blood loss (chronic): Secondary | ICD-10-CM | POA: Diagnosis not present

## 2022-08-03 DIAGNOSIS — E669 Obesity, unspecified: Secondary | ICD-10-CM | POA: Diagnosis not present

## 2022-09-28 DIAGNOSIS — Z23 Encounter for immunization: Secondary | ICD-10-CM | POA: Diagnosis not present

## 2022-09-28 DIAGNOSIS — I10 Essential (primary) hypertension: Secondary | ICD-10-CM | POA: Diagnosis not present

## 2022-09-28 DIAGNOSIS — Z0000 Encounter for general adult medical examination without abnormal findings: Secondary | ICD-10-CM | POA: Diagnosis not present

## 2022-09-28 DIAGNOSIS — E559 Vitamin D deficiency, unspecified: Secondary | ICD-10-CM | POA: Diagnosis not present

## 2022-09-28 DIAGNOSIS — Z13220 Encounter for screening for lipoid disorders: Secondary | ICD-10-CM | POA: Diagnosis not present

## 2022-09-28 DIAGNOSIS — D500 Iron deficiency anemia secondary to blood loss (chronic): Secondary | ICD-10-CM | POA: Diagnosis not present

## 2022-09-28 DIAGNOSIS — E669 Obesity, unspecified: Secondary | ICD-10-CM | POA: Diagnosis not present

## 2022-11-04 ENCOUNTER — Inpatient Hospital Stay: Payer: BC Managed Care – PPO | Admitting: Hematology and Oncology

## 2022-11-04 ENCOUNTER — Other Ambulatory Visit: Payer: Self-pay | Admitting: Hematology and Oncology

## 2022-11-04 ENCOUNTER — Inpatient Hospital Stay: Payer: BC Managed Care – PPO | Attending: Internal Medicine

## 2022-11-04 DIAGNOSIS — D508 Other iron deficiency anemias: Secondary | ICD-10-CM

## 2022-11-04 DIAGNOSIS — R718 Other abnormality of red blood cells: Secondary | ICD-10-CM

## 2022-11-04 NOTE — Progress Notes (Unsigned)
[place] [phone]   Fax:(336) [phone]  PROGRESS NOTE  Patient Care Team: Leeroy Cha, MD as PCP - General (Internal Medicine) Patient, No Pcp Per (General Practice)  Hematological/Oncological History # Erythrocytosis and microcytosis  1) Labs from PCP, Dr. Leeroy Cha at [facility] -07/14/2020: Ferritin 47.4 -07/27/2020: WBC 6.2, RBC 6.09 (H), Hgb 12.6, MCV 64.2 (L), Plt 207 -05/11/2021: WBC 6.8., RBC 6.00 (H), Hgb 12.2, MCV 63.7 (L), Plt 211 2) 01/25/2022: Establish care with [facility]  Interval History:  Danielle Simmons 36 y.o. female with medical history significant for iron deficiency anemia and beta thalassemia trait who presents for a follow up visit. The patient's last visit was on 01/25/2022. In the interim since the last visit he has continued on p.o. iron therapy.  On exam today [name] ***  MEDICAL HISTORY:  Past Medical History:  Diagnosis Date   Beta thalassemia (HCC)    Bronchitis    Fatty liver    Headache    during pregnancy, Tylenol helped   Hypertension    No pertinent past medical history     SURGICAL HISTORY: Past Surgical History:  Procedure Laterality Date   NO PAST SURGERIES     prolapsed uterus  2018    SOCIAL HISTORY: Social History   Socioeconomic History   Marital status: Married    Spouse name: Not on file   Number of children: Not on file   Years of education: Not on file   Highest education level: Not on file  Occupational History   Not on file  Tobacco Use   Smoking status: Never   Smokeless tobacco: Never  Substance and Sexual Activity   Alcohol use: Not Currently   Drug use: Not Currently   Sexual activity: Yes  Other Topics Concern   Not on file  Social History Narrative   ** Merged History Encounter **       Social Determinants of Health   Financial Resource Strain: Not on file  Food Insecurity: Not on file  Transportation Needs: Not on file   Physical Activity: Not on file  Stress: Not on file  Social Connections: Not on file  Intimate Partner Violence: Not on file    FAMILY HISTORY: Family History  Problem Relation Age of Onset   Hypertension Mother    Thyroid disease Sister     ALLERGIES:  is allergic to banana.  MEDICATIONS:  Current Outpatient Medications  Medication Sig Dispense Refill   acetaminophen (TYLENOL) 500 MG tablet Take 1,000 mg by mouth every 6 (six) hours as needed for headache.     ferrous sulfate 325 (65 FE) MG tablet Take 1 tablet (325 mg total) by mouth daily with breakfast. 30 tablet 3   Milk Thistle 200 MG CAPS Take by mouth daily.     No current facility-administered medications for this visit.    REVIEW OF SYSTEMS:   Constitutional: ( - ) fevers, ( - )  chills , ( - ) night sweats Eyes: ( - ) blurriness of vision, ( - ) double vision, ( - ) watery eyes Ears, nose, mouth, throat, and face: ( - ) mucositis, ( - ) sore throat Respiratory: ( - ) cough, ( - ) dyspnea, ( - ) wheezes Cardiovascular: ( - ) palpitation, ( - ) chest discomfort, ( - ) lower extremity swelling Gastrointestinal:  ( - ) nausea, ( - ) heartburn, ( - ) change in bowel habits Skin: ( - ) abnormal skin  rashes Lymphatics: ( - ) new lymphadenopathy, ( - ) easy bruising Neurological: ( - ) numbness, ( - ) tingling, ( - ) new weaknesses Behavioral/Psych: ( - ) mood change, ( - ) new changes  All other systems were reviewed with the patient and are negative.  PHYSICAL EXAMINATION: There were no vitals filed for this visit. There were no vitals filed for this visit.  GENERAL: *** alert, no distress and comfortable SKIN: skin color, texture, turgor are normal, no rashes or significant lesions EYES: conjunctiva are pink and non-injected, sclera clear LUNGS: clear to auscultation and percussion with normal breathing effort HEART: regular rate & rhythm and no murmurs and no lower extremity edema Musculoskeletal: no cyanosis  of digits and no clubbing  PSYCH: alert & oriented x 3, fluent speech NEURO: no focal motor/sensory deficits  LABORATORY DATA:  I have reviewed the data as listed    Latest Ref Rng & Units 05/04/2022    9:17 AM 01/25/2022   10:04 AM 07/08/2018    3:49 PM  CBC  WBC 4.0 - 10.5 K/uL 7.3  6.2  7.7   Hemoglobin 12.0 - 15.0 g/dL 12.8  12.9  12.0   Hematocrit 36.0 - 46.0 % 40.3  40.5  39.0   Platelets 150 - 400 K/uL 188  198  207        Latest Ref Rng & Units 05/04/2022    9:17 AM 01/25/2022   10:04 AM 07/08/2018    3:49 PM  CMP  Glucose 70 - 99 mg/dL 114  102  94   BUN 6 - 20 mg/dL 11  21  11    Creatinine 0.44 - 1.00 mg/dL 0.50  0.76  0.49   Sodium 135 - 145 mmol/L 136  136  137   Potassium 3.5 - 5.1 mmol/L 4.1  4.3  3.8   Chloride 98 - 111 mmol/L 107  104  103   CO2 22 - 32 mmol/L 23  26  26    Calcium 8.9 - 10.3 mg/dL 9.2  9.5  9.8   Total Protein 6.5 - 8.1 g/dL 8.2  8.4    Total Bilirubin 0.3 - 1.2 mg/dL 0.7  0.4    Alkaline Phos 38 - 126 U/L 53  54    AST 15 - 41 U/L 33  39    ALT 0 - 44 U/L 48  73     RADIOGRAPHIC STUDIES: No results found.  ASSESSMENT & PLAN Danielle Simmons 36 y.o. female with medical history significant for iron deficiency anemia and beta thalassemia trait who presents for a follow up visit.    [place] [phone]   Fax:(336) [phone]   PROGRESS NOTE   Patient Care Team: Leeroy Cha, MD as PCP - General (Internal Medicine) Patient, No Pcp Per (Inactive) (General Practice)   Hematological/Oncological History 1) Labs from PCP, Dr. Leeroy Cha at [facility] -07/14/2020: Ferritin 47.4 -07/27/2020: WBC 6.2, RBC 6.09 (H), Hgb 12.6, MCV 64.2 (L), Plt 207 -05/11/2021: WBC 6.8., RBC 6.00 (H), Hgb 12.2, MCV 63.7 (L), Plt 211   2) 01/25/2022: Establish care with [facility]   CHIEF COMPLAINTS/PURPOSE OF CONSULTATION:  -Iron deficiency -Beta-thalassemia minor   HISTORY OF PRESENTING ILLNESS:  Danielle  Justice Simmons 35 y.o. female returns for a follow up for iron deficiency. She was last seen on 01/25/2022 and denies any changes to her health.    On exam today, Ms. Abdulla reports that she reports improvement of energy levels  as she is eating healthy and trying to be more active. She has lost approximately 8 lbs since January 2023. She denies any nausea, vomiting or abdominal pain. Her bowel habits are  unchanged without any recurrent episodes of diarrhea or constipation. She denies easy bruising or signs of active bleeding.  She denies any fevers, chills, night sweats, shortness of breath, chest pain or cough.  She has no other complaints.  Remaining 10 point ROS is below.   MEDICAL HISTORY:      Past Medical History:  Diagnosis Date   Beta thalassemia (HCC)     Bronchitis     Fatty liver     Headache      during pregnancy, Tylenol helped   Hypertension     No pertinent past medical history        SURGICAL HISTORY:      Past Surgical History:  Procedure Laterality Date   NO PAST SURGERIES       prolapsed uterus   2018      SOCIAL HISTORY: Social History         Socioeconomic History   Marital status: Married      Spouse name: Not on file   Number of children: Not on file   Years of education: Not on file   Highest education level: Not on file  Occupational History   Not on file  Tobacco Use   Smoking status: Never   Smokeless tobacco: Never  Substance and Sexual Activity   Alcohol use: Not Currently   Drug use: Not Currently   Sexual activity: Yes  Other Topics Concern   Not on file  Social History Narrative    ** Merged History Encounter **         Social Determinants of Health    Financial Resource Strain: Not on file  Food Insecurity: Not on file  Transportation Needs: Not on file  Physical Activity: Not on file  Stress: Not on file  Social Connections: Not on file  Intimate Partner Violence: Not on file      FAMILY HISTORY:      Family History   Problem Relation Age of Onset   Hypertension Mother     Thyroid disease Sister        ALLERGIES:  is allergic to banana.   MEDICATIONS:        Current Outpatient Medications  Medication Sig Dispense Refill   acetaminophen (TYLENOL) 500 MG tablet Take 1,000 mg by mouth every 6 (six) hours as needed for headache.       Milk Thistle 200 MG CAPS Take by mouth daily.       ferrous sulfate 325 (65 FE) MG tablet Take 1 tablet (325 mg total) by mouth daily with breakfast. 30 tablet 3    No current facility-administered medications for this visit.      REVIEW OF SYSTEMS:   Constitutional: ( - ) fevers, ( - )  chills , ( - ) night sweats Eyes: ( - ) blurriness of vision, ( - ) double vision, ( - ) watery eyes Ears, nose, mouth, throat, and face: ( - ) mucositis, ( - ) sore throat Respiratory: ( - ) cough, ( - ) dyspnea, ( - ) wheezes Cardiovascular: ( - ) palpitation, ( - ) chest discomfort, ( - ) lower extremity swelling Gastrointestinal:  ( - ) nausea, ( - ) heartburn, ( - ) change in bowel habits Skin: ( - ) abnormal skin rashes Lymphatics: ( - )  new lymphadenopathy, ( - ) easy bruising Neurological: ( - ) numbness, ( - ) tingling, ( - ) new weaknesses Behavioral/Psych: ( - ) mood change, ( - ) new changes  All other systems were reviewed with the patient and are negative.   PHYSICAL EXAMINATION: ECOG PERFORMANCE STATUS: 0 - Asymptomatic      Vitals:    05/04/22 0927  BP: (!) 131/92  Pulse: 81  Resp: 20  Temp: 97.9 F (36.6 C)  SpO2: 99%       Filed Weights    05/04/22 0927  Weight: 186 lb 4.8 oz (84.5 kg)      GENERAL: well appearing female in NAD  SKIN: skin color, texture, turgor are normal, no rashes or significant lesions EYES: conjunctiva are pink and non-injected, sclera clear LUNGS: clear to auscultation and percussion with normal breathing effort HEART: regular rate & rhythm and no murmurs and no lower extremity edema Musculoskeletal: no cyanosis of digits  and no clubbing  PSYCH: alert & oriented x 3, fluent speech NEURO: no focal motor/sensory deficits   LABORATORY DATA:  I have reviewed the data as listed     Latest Ref Rng & Units 05/04/2022    9:17 AM 01/25/2022   10:04 AM 07/08/2018    3:49 PM  CBC  WBC 4.0 - 10.5 K/uL 7.3   6.2   7.7    Hemoglobin 12.0 - 15.0 g/dL 01.0   93.2   35.5    Hematocrit 36.0 - 46.0 % 40.3   40.5   39.0    Platelets 150 - 400 K/uL 188   198   207            Latest Ref Rng & Units 05/04/2022    9:17 AM 01/25/2022   10:04 AM 07/08/2018    3:49 PM  CMP  Glucose 70 - 99 mg/dL 732   202   94    BUN 6 - 20 mg/dL 11   21   11     Creatinine 0.44 - 1.00 mg/dL   5.42   7.06    Sodium 135 - 145 mmol/L 136   136   137    Potassium 3.5 - 5.1 mmol/L 4.1   4.3   3.8    Chloride 98 - 111 mmol/L 107   104   103    CO2 22 - 32 mmol/L 23   26   26     Calcium 8.9 - 10.3 mg/dL 9.2   9.5   9.8    Total Protein 6.5 - 8.1 g/dL 8.2   8.4      Total Bilirubin 0.3 - 1.2 mg/dL 0.7   0.4      Alkaline Phos 38 - 126 U/L 53   54      AST 15 - 41 U/L 33   39      ALT 0 - 44 U/L 48   73          ASSESSMENT & PLAN Danielle Simmons is a 36 y.o. female who returns for a follow up for iron deficiency and beta-thalassemia minor.    #Iron deficiency without anemia: --Labs today show *** --Recommend to take ferrous sulfate 325 mg once a day with a source of vitamin C.  --Encouraged to eat iron rich foods.  --RTC in 6 months with labs.    #Microcytosis: --Secondary to beta thalassemia minor that was confirmed with hgb electrophoresis on 01/25/2022.  --No intervention at this  time.    #Elevated ALT levels: --Likely secondary to fatty liver --Levels have improved today to *** U/L as patient has been working on diet modifications.  --Continue to closely monitor.    No orders of the defined types were placed in this encounter.   All questions were answered. The patient knows to call the clinic with any problems,  questions or concerns.  A total of more than 30 minutes were spent on this encounter with face-to-face time and non-face-to-face time, including preparing to see the patient, ordering tests and/or medications, counseling the patient and coordination of care as outlined above.   Ledell Peoples, MD Department of Hematology/Oncology [place] at [facility] Phone: [phone] Pager: [phone] Email: [email or web address]  11/04/2022 8:35 AM

## 2023-04-07 ENCOUNTER — Encounter: Payer: Self-pay | Admitting: Emergency Medicine

## 2023-04-07 ENCOUNTER — Ambulatory Visit (INDEPENDENT_AMBULATORY_CARE_PROVIDER_SITE_OTHER): Payer: BC Managed Care – PPO

## 2023-04-07 ENCOUNTER — Ambulatory Visit: Payer: BC Managed Care – PPO

## 2023-04-07 ENCOUNTER — Ambulatory Visit
Admission: EM | Admit: 2023-04-07 | Discharge: 2023-04-07 | Disposition: A | Discharge status: Home / Self Care | Payer: BC Managed Care – PPO | Attending: Family Medicine | Admitting: Family Medicine

## 2023-04-07 DIAGNOSIS — M533 Sacrococcygeal disorders, not elsewhere classified: Secondary | ICD-10-CM | POA: Diagnosis not present

## 2023-04-07 DIAGNOSIS — W010XXA Fall on same level from slipping, tripping and stumbling without subsequent striking against object, initial encounter: Secondary | ICD-10-CM

## 2023-04-07 MED ORDER — KETOROLAC TROMETHAMINE 30 MG/ML IJ SOLN
30.0000 mg | Freq: Once | INTRAMUSCULAR | Status: AC
  Administered 2023-04-07: 30 mg via INTRAMUSCULAR

## 2023-04-07 NOTE — ED Provider Notes (Signed)
[facility]    CSN: [id number] Arrival date & time: 04/07/23  1111      History   Chief Complaint Chief Complaint  Patient presents with   Tailbone Pain    HPI Danielle Simmons is a 37 y.o. female.   Patient is here for pain at her tailbone after a fall yesterday.  She was getting out of her car and slipped and landed at the buttock in her garage.  She did have some pain immediately, but the pain is worse today.  Some pain into her buttocks.  Some numbness sensation at times in her legs when she sits too long, but not currently.  No weakness.  She did take motrin this morning without help.        Past Medical History:  Diagnosis Date   Beta thalassemia    Bronchitis    Fatty liver    Headache    during pregnancy, Tylenol helped   Hypertension    No pertinent past medical history     Patient Active Problem List   Diagnosis Date Noted   Dietary iron deficiency without anemia 05/04/2022   Erythrocytosis 01/25/2022   Microcytosis 01/25/2022   Status post normal vaginal delivery 10/15/2014   Postpartum hemorrhage 10/15/2014   Large for gestational age (LGA) 10/14/2014    Past Surgical History:  Procedure Laterality Date   NO PAST SURGERIES     prolapsed uterus  2018    OB History     Gravida  4   Para  3   Term  3   Preterm  0   AB  0   Living  3      SAB  0   IAB  0   Ectopic  0   Multiple      Live Births  3            Home Medications    Prior to Admission medications   Medication Sig Start Date End Date Taking? Authorizing Provider  acetaminophen (TYLENOL) 500 MG tablet Take 1,000 mg by mouth every 6 (six) hours as needed for headache.    [provider]  ferrous sulfate 325 (65 FE) MG tablet Take 1 tablet (325 mg total) by mouth daily with breakfast. 05/04/22   Briant Cedar, PA-C  Milk Thistle 200 MG CAPS Take by mouth daily.    [provider]    Family History Family History   Problem Relation Age of Onset   Hypertension Mother    Thyroid disease Sister     Social History Social History   Tobacco Use   Smoking status: Never   Smokeless tobacco: Never  Substance Use Topics   Alcohol use: Not Currently   Drug use: Not Currently     Allergies   Banana   Review of Systems Review of Systems  Constitutional: Negative.   HENT: Negative.    Respiratory: Negative.    Cardiovascular: Negative.   Gastrointestinal: Negative.   Genitourinary: Negative.   Musculoskeletal:  Positive for back pain.     Physical Exam Triage Vital Signs ED Triage Vitals [04/07/23 1137]  Enc Vitals Group     BP (!) 157/92     Pulse Rate 79     Resp 18     Temp 98.1 F (36.7 C)     Temp Source Oral     SpO2 98 %     Weight      Height  Head Circumference      Peak Flow      Pain Score 10     Pain Loc      Pain Edu?      Excl. in GC?    No data found.  Updated Vital Signs BP (!) 157/92 (BP Location: Right Arm)   Pulse 79   Temp 98.1 F (36.7 C) (Oral)   Resp 18   SpO2 98%   Visual Acuity Right Eye Distance:   Left Eye Distance:   Bilateral Distance:    Right Eye Near:   Left Eye Near:    Bilateral Near:     Physical Exam Constitutional:      Appearance: Normal appearance.  Musculoskeletal:     Comments: +TTP to the sacrum/coccyx.  No mild TTP to the buttocks;    Skin:    Comments: Small bruise at the right medial buttock  Neurological:     General: No focal deficit present.     Mental Status: She is alert.  Psychiatric:        Mood and Affect: Mood normal.      UC Treatments / Results  Labs (all labs ordered are listed, but only abnormal results are displayed) Labs Reviewed - No data to display  EKG   Radiology DG Sacrum/Coccyx  Result Date: 04/07/2023 CLINICAL DATA:  Pain EXAM: SACRUM AND COCCYX - 3 VIEW COMPARISON:  None Available. FINDINGS: There is no evidence of fracture or other focal bone lesions. IUD incidentally  noted. IMPRESSION: Negative. Electronically Signed   By: Layla Maw M.D.   On: 04/07/2023 12:06    Procedures Procedures (including critical care time)  Medications Ordered in UC Medications  ketorolac (TORADOL) 30 MG/ML injection 30 mg (has no administration in time range)    Initial Impression / Assessment and Plan / UC Course  I have reviewed the triage vital signs and the nursing notes.  Pertinent labs & imaging results that were available during my care of the patient were reviewed by me and considered in my medical decision making (see chart for details).   Final Clinical Impressions(s) / UC Diagnoses   Final diagnoses:  Coccyx pain     Discharge Instructions      You were seen today for tailbone pain after fall. Your xray was negative today.  I recommend you use tylenol/motrin for pain.  You may use an ice pack as well if helpful.  I recommend you purchase a donut pillow to use while sitting.  If not improving or worsening then please follow up here or with your doctor.     ED Prescriptions   None    PDMP not reviewed this encounter.   Jannifer Franklin, MD 04/07/23 [phone]

## 2023-04-07 NOTE — Discharge Instructions (Signed)
You were seen today for tailbone pain after fall. Your xray was negative today.  I recommend you use tylenol/motrin for pain.  You may use an ice pack as well if helpful.  I recommend you purchase a donut pillow to use while sitting.  If not improving or worsening then please follow up here or with your doctor.

## 2023-04-07 NOTE — ED Triage Notes (Signed)
Pt presents to UC with tailbone pain after slipping and falling yesterday in the garage.

## 2023-04-19 DIAGNOSIS — I10 Essential (primary) hypertension: Secondary | ICD-10-CM | POA: Diagnosis not present

## 2023-04-19 DIAGNOSIS — G43909 Migraine, unspecified, not intractable, without status migrainosus: Secondary | ICD-10-CM | POA: Diagnosis not present

## 2023-04-19 DIAGNOSIS — E559 Vitamin D deficiency, unspecified: Secondary | ICD-10-CM | POA: Diagnosis not present

## 2023-04-19 DIAGNOSIS — K7581 Nonalcoholic steatohepatitis (NASH): Secondary | ICD-10-CM | POA: Diagnosis not present

## 2023-04-19 DIAGNOSIS — E785 Hyperlipidemia, unspecified: Secondary | ICD-10-CM | POA: Diagnosis not present

## 2023-07-06 DIAGNOSIS — I10 Essential (primary) hypertension: Secondary | ICD-10-CM | POA: Diagnosis not present

## 2023-07-06 DIAGNOSIS — K7581 Nonalcoholic steatohepatitis (NASH): Secondary | ICD-10-CM | POA: Diagnosis not present

## 2023-07-06 DIAGNOSIS — J309 Allergic rhinitis, unspecified: Secondary | ICD-10-CM | POA: Diagnosis not present

## 2023-07-06 DIAGNOSIS — E559 Vitamin D deficiency, unspecified: Secondary | ICD-10-CM | POA: Diagnosis not present

## 2023-07-21 DIAGNOSIS — R002 Palpitations: Secondary | ICD-10-CM | POA: Diagnosis not present

## 2023-10-02 DIAGNOSIS — E559 Vitamin D deficiency, unspecified: Secondary | ICD-10-CM | POA: Diagnosis not present

## 2023-10-02 DIAGNOSIS — R002 Palpitations: Secondary | ICD-10-CM | POA: Diagnosis not present

## 2023-10-02 DIAGNOSIS — I10 Essential (primary) hypertension: Secondary | ICD-10-CM | POA: Diagnosis not present

## 2023-10-02 DIAGNOSIS — K7581 Nonalcoholic steatohepatitis (NASH): Secondary | ICD-10-CM | POA: Diagnosis not present

## 2023-10-02 DIAGNOSIS — E669 Obesity, unspecified: Secondary | ICD-10-CM | POA: Diagnosis not present

## 2023-10-02 DIAGNOSIS — Z23 Encounter for immunization: Secondary | ICD-10-CM | POA: Diagnosis not present

## 2023-10-16 DIAGNOSIS — R002 Palpitations: Secondary | ICD-10-CM | POA: Diagnosis not present

## 2023-10-17 DIAGNOSIS — R002 Palpitations: Secondary | ICD-10-CM | POA: Diagnosis not present

## 2023-12-03 ENCOUNTER — Ambulatory Visit
Admission: EM | Admit: 2023-12-03 | Discharge: 2023-12-03 | Disposition: A | Discharge status: Home / Self Care | Payer: BC Managed Care – PPO

## 2023-12-03 DIAGNOSIS — J0100 Acute maxillary sinusitis, unspecified: Secondary | ICD-10-CM | POA: Diagnosis not present

## 2023-12-03 DIAGNOSIS — J209 Acute bronchitis, unspecified: Secondary | ICD-10-CM

## 2023-12-03 DIAGNOSIS — H6593 Unspecified nonsuppurative otitis media, bilateral: Secondary | ICD-10-CM

## 2023-12-03 MED ORDER — AMOXICILLIN-POT CLAVULANATE 875-125 MG PO TABS: 1.0000 | ORAL_TABLET | Freq: Two times a day (BID) | ORAL | 0 refills | Status: AC

## 2023-12-03 MED ORDER — PREDNISONE 10 MG (21) PO TBPK: ORAL_TABLET | Freq: Every day | ORAL | 0 refills | Status: DC

## 2023-12-03 MED ORDER — ALBUTEROL SULFATE (2.5 MG/3ML) 0.083% IN NEBU
2.5000 mg | INHALATION_SOLUTION | Freq: Once | RESPIRATORY_TRACT | Status: AC
Start: 2023-12-03 — End: 2023-12-03
  Administered 2023-12-03: 2.5 mg via RESPIRATORY_TRACT

## 2023-12-03 MED ORDER — ALBUTEROL SULFATE HFA 108 (90 BASE) MCG/ACT IN AERS: 1.0000 | INHALATION_SPRAY | Freq: Four times a day (QID) | RESPIRATORY_TRACT | 0 refills | Status: DC | PRN

## 2023-12-03 NOTE — Discharge Instructions (Addendum)
Please start taking the augmentin twice daily with food. Take until completed. Consider an over the counter probiotic or daily yogurt to prevent adverse GI symptoms.  Take the prednisone taper per package instructions.  Only use the albuterol inhaler every 4-6 hours as needed for tightness in the chest, shortness of breath or wheezing.   Please schedule a follow up with your PCP to determine long term management in the case that this is related to your new cat.  Please rest and drink plenty of water.

## 2023-12-03 NOTE — ED Triage Notes (Signed)
"  This started with a Cough about 3 wks ago which has progressed with SOB especially when trying to lie down and sleep". "Now also more congestion in nose". "I will followup with PCP due to maybe having an allergy to my new cat". No fever.

## 2023-12-03 NOTE — ED Provider Notes (Signed)
 [facility]    CSN: [id number] Arrival date & time: 12/03/23  4098      History   Chief Complaint Chief Complaint  Patient presents with   Cough   Shortness of Breath    HPI Danielle Simmons is a 37 y.o. female.   Pleasant 37 year old female presents today due to concerns of cough, shortness of breath, nasal congestion, sinus discomfort, postnasal drainage, and throat irritation.  Symptoms have been present for the past 3+ weeks.  She got a cat 5 months ago, and feels like she may be developing allergies to the cat.  She has been trying over-the-counter Robitussin which has helped somewhat with the cough.  She also has tried over-the-counter antihistamines with some improvement to her sneezing and congestion symptoms.  She states however that she feels as though her chest is becoming tight.  She does have a history of bronchitis that has required nebulizer treatments in the past.  She denies audible wheezing, no stridor.  She denies a fever.  She is concerned as she had a tough time sleeping last night as anytime she laid flat she was having significant wheezing and tightness.  She denies a cardiac history, no history of CHF.  She denies edema or weight gain.  No PND.   Cough Associated symptoms: shortness of breath   Shortness of Breath Associated symptoms: cough     Past Medical History:  Diagnosis Date   Beta thalassemia (HCC)    Bronchitis    Fatty liver    Headache    during pregnancy, Tylenol helped   Hypertension     Patient Active Problem List   Diagnosis Date Noted   Dietary iron deficiency without anemia 05/04/2022   Erythrocytosis 01/25/2022   Microcytosis 01/25/2022   Status post normal vaginal delivery 10/15/2014   Postpartum hemorrhage 10/15/2014   Large for gestational age (LGA) 10/14/2014    Past Surgical History:  Procedure Laterality Date   NO PAST SURGERIES     prolapsed uterus  2018    OB History     Gravida  4   Para  3    Term  3   Preterm  0   AB  0   Living  3      SAB  0   IAB  0   Ectopic  0   Multiple      Live Births  3            Home Medications    Prior to Admission medications   Medication Sig Start Date End Date Taking? Authorizing Provider  albuterol (VENTOLIN HFA) 108 (90 Base) MCG/ACT inhaler Inhale 1-2 puffs into the lungs every 6 (six) hours as needed for wheezing or shortness of breath. 12/03/23  Yes Estee Yohe,  L, PA  amoxicillin-clavulanate (AUGMENTIN) 875-125 MG tablet Take 1 tablet by mouth 2 (two) times daily with a meal for 10 days. 12/03/23 12/13/23 Yes Joson Sapp,  L, PA  guaifenesin (ROBITUSSIN) 100 MG/5ML syrup Take 200 mg by mouth 3 (three) times daily as needed for cough.   Yes [provider]  predniSONE (STERAPRED UNI-PAK 21 TAB) 10 MG (21) TBPK tablet Take by mouth daily. Take 6 tabs by mouth daily  for 1 days, then 5 tabs for 1 days, then 4 tabs for 1 days, then 3 tabs for 1 days, 2 tabs for 1 days, then 1 tab by mouth daily for 1 days 12/03/23  Yes Mardel Grudzien,  L, PA  valsartan-hydrochlorothiazide (  DIOVAN-HCT) 80-12.5 MG tablet Take 2 tablets by mouth daily. 07/06/23  Yes [provider]  Vitamin D, Ergocalciferol, 50000 units CAPS Take 1 capsule by mouth once a week. 09/06/23  Yes [provider]  WEGOVY 0.5 MG/0.5ML SOAJ Inject 0.5 mg into the skin once a week. 06/24/23  Yes [provider]  acetaminophen (TYLENOL) 500 MG tablet Take 1,000 mg by mouth every 6 (six) hours as needed for headache.    [provider]  ferrous sulfate 325 (65 FE) MG tablet Take 1 tablet (325 mg total) by mouth daily with breakfast. 05/04/22   Briant Cedar, PA-C  Milk Thistle 200 MG CAPS Take by mouth daily.    [provider]    Family History Family History  Problem Relation Age of Onset   Hypertension Mother    Thyroid disease Sister     Social History Social History   Tobacco Use   Smoking status: Never    Smokeless tobacco: Never  Vaping Use   Vaping status: Never Used  Substance Use Topics   Alcohol use: Not Currently   Drug use: Not Currently     Allergies   Banana   Review of Systems Review of Systems  Respiratory:  Positive for cough and shortness of breath.   As per HPI   Physical Exam Triage Vital Signs ED Triage Vitals  Encounter Vitals Group     BP 12/03/23 0938 111/70     Systolic BP Percentile --      Diastolic BP Percentile --      Pulse Rate 12/03/23 0938 88     Resp 12/03/23 0938 18     Temp 12/03/23 0938 97.7 F (36.5 C)     Temp Source 12/03/23 0938 Oral     SpO2 12/03/23 0938 97 %     Weight 12/03/23 0934 186 lb 4.6 oz (84.5 kg)     Height 12/03/23 0934 5' (1.524 m)     Head Circumference --      Peak Flow --      Pain Score 12/03/23 0934 0     Pain Loc --      Pain Education --      Exclude from Growth Chart --    No data found.  Updated Vital Signs BP 111/70 (BP Location: Right Arm)   Pulse 88   Temp 97.7 F (36.5 C) (Oral)   Resp 18   Ht 5' (1.524 m)   Wt 186 lb 4.6 oz (84.5 kg)   LMP 12/01/2023 (Exact Date)   SpO2 97%   BMI 36.38 kg/m   Visual Acuity Right Eye Distance:   Left Eye Distance:   Bilateral Distance:    Right Eye Near:   Left Eye Near:    Bilateral Near:     Physical Exam Vitals and nursing note reviewed.  Constitutional:      General: She is not in acute distress.    Appearance: She is well-developed and normal weight. She is not ill-appearing, toxic-appearing or diaphoretic.  HENT:     Head: Normocephalic and atraumatic.     Jaw: There is normal jaw occlusion.     Salivary Glands: Right salivary gland is not diffusely enlarged or tender. Left salivary gland is not tender.     Right Ear: No drainage, swelling or tenderness. A middle ear effusion is present. There is no impacted cerumen. No foreign body. No mastoid tenderness. Tympanic membrane is not injected, erythematous, retracted or bulging.  Left Ear:  No drainage, swelling or tenderness. A middle ear effusion is present. There is no impacted cerumen. No foreign body. No mastoid tenderness. Tympanic membrane is not injected, erythematous, retracted or bulging.     Nose: Congestion present.     Left Nostril: Epistaxis (L nare mild bright red blood) present.     Right Turbinates: Swollen.     Left Turbinates: Swollen.     Right Sinus: Maxillary sinus tenderness present. No frontal sinus tenderness.     Left Sinus: Maxillary sinus tenderness present. No frontal sinus tenderness.     Mouth/Throat:     Lips: Pink.     Pharynx: Oropharynx is clear. Uvula midline. Postnasal drip present. No pharyngeal swelling, oropharyngeal exudate, posterior oropharyngeal erythema or uvula swelling.  Cardiovascular:     Rate and Rhythm: Normal rate and regular rhythm.  Pulmonary:     Effort: Pulmonary effort is normal. No respiratory distress.     Breath sounds: No stridor. Wheezing (scant wheezing posterior lung fields, resolved s/p nebulizer treatment) present.  Musculoskeletal:     Cervical back: Normal range of motion and neck supple. No tenderness.  Lymphadenopathy:     Cervical: No cervical adenopathy.  Skin:    General: Skin is warm and dry.     Coloration: Skin is not jaundiced.     Findings: No bruising, erythema or rash.  Neurological:     General: No focal deficit present.     Mental Status: She is alert and oriented to person, place, and time.      UC Treatments / Results  Labs (all labs ordered are listed, but only abnormal results are displayed) Labs Reviewed - No data to display  EKG   Radiology No results found.  Procedures Procedures (including critical care time)  Medications Ordered in UC Medications  albuterol (PROVENTIL) (2.5 MG/3ML) 0.083% nebulizer solution 2.5 mg (2.5 mg Nebulization Given 12/03/23 1028)    Initial Impression / Assessment and Plan / UC Course  I have reviewed the triage vital signs and the nursing  notes.  Pertinent labs & imaging results that were available during my care of the patient were reviewed by me and considered in my medical decision making (see chart for details).     Acute maxillary sinusitis - it has now been 3.5 weeks. May have started out as viral vs allergic, however given worsening sx and timeframe, I feel it is reasonable to cover for bacterial pathogens at this time. Will start with augmentin BID. Middle ear effusion - saline nasal sprays encouraged. Avoid steroid nasal sprays due to bleeding in nare,. Acute bronchitis - significant improvement noted s/p neb tx in office. Will d/c home on PO steroid pack and albuterol inhaler.   Final Clinical Impressions(s) / UC Diagnoses   Final diagnoses:  Acute non-recurrent maxillary sinusitis  Middle ear effusion, bilateral  Acute bronchitis, unspecified organism     Discharge Instructions      Please start taking the augmentin twice daily with food. Take until completed. Consider an over the counter probiotic or daily yogurt to prevent adverse GI symptoms.  Take the prednisone taper per package instructions.  Only use the albuterol inhaler every 4-6 hours as needed for tightness in the chest, shortness of breath or wheezing.   Please schedule a follow up with your PCP to determine long term management in the case that this is related to your new cat.  Please rest and drink plenty of water.  ED Prescriptions     Medication Sig Dispense Auth. Provider   albuterol (VENTOLIN HFA) 108 (90 Base) MCG/ACT inhaler Inhale 1-2 puffs into the lungs every 6 (six) hours as needed for wheezing or shortness of breath. 8 g Kristi Norment,  L, PA   amoxicillin-clavulanate (AUGMENTIN) 875-125 MG tablet Take 1 tablet by mouth 2 (two) times daily with a meal for 10 days. 20 tablet Dillon Livermore,  L, PA   predniSONE (STERAPRED UNI-PAK 21 TAB) 10 MG (21) TBPK tablet Take by mouth daily. Take 6 tabs by mouth daily  for 1 days, then  5 tabs for 1 days, then 4 tabs for 1 days, then 3 tabs for 1 days, 2 tabs for 1 days, then 1 tab by mouth daily for 1 days 21 tablet Alok Minshall,  L, PA      PDMP not reviewed this encounter.   Maretta Bees, [place] 12/03/23 1112

## 2024-03-06 ENCOUNTER — Other Ambulatory Visit: Payer: Self-pay

## 2024-03-06 ENCOUNTER — Emergency Department (HOSPITAL_COMMUNITY)
Admission: EM | Admit: 2024-03-06 | Discharge: 2024-03-06 | Disposition: A | Discharge status: Home / Self Care | Attending: Emergency Medicine | Admitting: Emergency Medicine

## 2024-03-06 ENCOUNTER — Encounter (HOSPITAL_COMMUNITY): Payer: Self-pay | Admitting: *Deleted

## 2024-03-06 DIAGNOSIS — Z79899 Other long term (current) drug therapy: Secondary | ICD-10-CM | POA: Diagnosis not present

## 2024-03-06 DIAGNOSIS — N939 Abnormal uterine and vaginal bleeding, unspecified: Secondary | ICD-10-CM | POA: Diagnosis not present

## 2024-03-06 DIAGNOSIS — I10 Essential (primary) hypertension: Secondary | ICD-10-CM | POA: Diagnosis not present

## 2024-03-06 LAB — CBC
HCT: 36.4 % (ref 36.0–46.0)
Hemoglobin: 11.6 g/dL — ABNORMAL LOW (ref 12.0–15.0)
MCH: 20.7 pg — ABNORMAL LOW (ref 26.0–34.0)
MCHC: 31.9 g/dL (ref 30.0–36.0)
MCV: 65 fL — ABNORMAL LOW (ref 80.0–100.0)
Platelets: 160 10*3/uL (ref 150–400)
RBC: 5.6 MIL/uL — ABNORMAL HIGH (ref 3.87–5.11)
RDW: 15.5 % (ref 11.5–15.5)
WBC: 5.1 10*3/uL (ref 4.0–10.5)
nRBC: 0 % (ref 0.0–0.2)

## 2024-03-06 LAB — HCG, SERUM, QUALITATIVE: Preg, Serum: NEGATIVE

## 2024-03-06 NOTE — ED Provider Triage Note (Signed)
 Emergency Medicine Provider Triage Evaluation Note  Danielle Simmons , a 38 y.o. female  was evaluated in triage.  Pt complains of vaginal bleeding.  She reports that her last menstrual period was from March 4 through March 7.  She reports onset of heavy bleeding since March 9.  She reports fatigue.  She denies nausea, vomiting, pain, syncope.  She apparently contacted her GYN's office today and was advised to come to the ED because they could not fit her in.  She denies prior history of dysfunctional uterine bleeding.  Review of Systems  Positive: Vaginal bleeding Negative: Fever, pain, near-syncope or syncope  Physical Exam  BP (!) 155/99 (BP Location: Right Arm)   Pulse 72   Temp 98.1 F (36.7 C) (Oral)   Resp 19   Wt 88.5 kg   LMP 02/27/2024   SpO2 100%   BMI 38.08 kg/m  Gen:   Awake, no distress   Resp:  Normal effort  MSK:   Moves extremities without difficulty  Other:    Medical Decision Making  Medically screening exam initiated at 11:47 AM.  Appropriate orders placed.  Danielle Simmons was informed that the remainder of the evaluation will be completed by another provider, this initial triage assessment does not replace that evaluation, and the importance of remaining in the ED until their evaluation is complete.  Patient understands need to remain in the ED for entirety of her evaluation.   Wynetta Fines, MD 03/06/24 1149

## 2024-03-06 NOTE — ED Triage Notes (Signed)
 Pt states that she is having heavy vaginal bleeding.  Pt states that her LMP was 3/4-3/7 and then pt reports she began having heavy vaginal bleeding 3/9.  She states that she is soaking 3 pads an hours.  Pt reports feeling fatigue.

## 2024-03-06 NOTE — ED Provider Notes (Signed)
 [place] EMERGENCY DEPARTMENT AT [facility] Provider Note   CSN: [id number] Arrival date & time: 03/06/24  1131     History  Chief Complaint  Patient presents with   Vaginal Bleeding    Danielle Simmons is a 38 y.o. female with past medical history of HTN, nonalcoholic fatty liver presents to emergency department for evaluation of bright red vaginal bleeding that started on 03/03/2024.  She reports that her LMP was 3/4-3/7 and was typical of her normal menses and on time.  She does have a nonhormonal IUD that was placed in 2022 and typically is regular with her menses.  She notes some associated dizziness and fatigue since vaginal bleeding started on Sunday morning.  While in emergency department, she has placed 3 pads over the past 4 hours.  She typically sees [facility] but has not seen them since 2021 as she has had no GYN concerns and has been following with her PCP.  She attempted to call to get an appointment with them today but they were unable to see here so they recommended ED evaluation.  She denies STD concerns, vaginal odor, vaginal discharge, urinary symptoms, nausea, vomiting, diarrhea, fevers at home.   Vaginal Bleeding Associated symptoms: no abdominal pain, no dizziness, no fatigue, no fever and no nausea       Home Medications Prior to Admission medications   Medication Sig Start Date End Date Taking? Authorizing Provider  valsartan-hydrochlorothiazide (DIOVAN-HCT) 80-12.5 MG tablet Take 2 tablets by mouth daily. 07/06/23  Yes [provider]  ferrous sulfate 325 (65 FE) MG tablet Take 1 tablet (325 mg total) by mouth daily with breakfast. Patient not taking: Reported on 03/06/2024 05/04/22   Briant Cedar, PA-C  predniSONE (STERAPRED UNI-PAK 21 TAB) 10 MG (21) TBPK tablet Take by mouth daily. Take 6 tabs by mouth daily  for 1 days, then 5 tabs for 1 days, then 4 tabs for 1 days, then 3 tabs for 1 days, 2 tabs for 1 days, then 1 tab by  mouth daily for 1 days Patient not taking: Reported on 03/06/2024 12/03/23   Maretta Bees, PA      Allergies    Banana    Review of Systems   Review of Systems  Constitutional:  Negative for chills, fatigue and fever.  Respiratory:  Negative for cough, chest tightness, shortness of breath and wheezing.   Cardiovascular:  Negative for chest pain and palpitations.  Gastrointestinal:  Negative for abdominal pain, constipation, diarrhea, nausea and vomiting.  Genitourinary:  Positive for vaginal bleeding.  Neurological:  Negative for dizziness, seizures, weakness, light-headedness, numbness and headaches.    Physical Exam Updated Vital Signs BP (!) 129/94   Pulse 76   Temp 97.7 F (36.5 C) (Oral)   Resp 15   Wt 88.5 kg   LMP 02/27/2024   SpO2 100%   BMI 38.08 kg/m  Physical Exam Vitals and nursing note reviewed. Exam conducted with a chaperone present.  Constitutional:      General: She is not in acute distress.    Appearance: Normal appearance.  HENT:     Head: Normocephalic and atraumatic.  Eyes:     Conjunctiva/sclera: Conjunctivae normal.  Cardiovascular:     Rate and Rhythm: Normal rate.  Pulmonary:     Effort: Pulmonary effort is normal. No respiratory distress.     Breath sounds: Normal breath sounds.  Abdominal:     General: Bowel sounds are normal. There is no  distension.     Palpations: Abdomen is soft.     Tenderness: There is no abdominal tenderness. There is no guarding or rebound.     Comments: No peritoneal sign. Negative heel tap  Genitourinary:    General: Normal vulva.     Vagina: No signs of injury. Bleeding present. No vaginal discharge.     Rectum: Normal.     Comments: Small amount of blood in vaginal vault. No discharge noted. Cervix appears normal with no friability nor lesions noted. IUD strings noted from cervix No CMT Musculoskeletal:     Right lower leg: No edema.     Left lower leg: No edema.  Skin:    Coloration: Skin is not  jaundiced or pale.  Neurological:     Mental Status: She is alert and oriented to person, place, and time. Mental status is at baseline.     Cranial Nerves: No cranial nerve deficit.     Motor: No weakness.     Coordination: Coordination normal.     Gait: Gait normal.    Crystal Hardie Shackleton chaperoned GU exam ED Results / Procedures / Treatments   Labs (all labs ordered are listed, but only abnormal results are displayed) Labs Reviewed  CBC - Abnormal; Notable for the following components:      Result Value   RBC 5.60 (*)    Hemoglobin 11.6 (*)    MCV 65.0 (*)    MCH 20.7 (*)    All other components within normal limits  HCG, SERUM, QUALITATIVE    EKG None  Radiology No results found.  Procedures Procedures    Medications Ordered in ED Medications - No data to display  ED Course/ Medical Decision Making/ A&P                                 Medical Decision Making Amount and/or Complexity of Data Reviewed Labs: ordered.   Patient presents to the ED for concern of vaginal bleeding, this involves an extensive number of treatment options, and is a complaint that carries with it a high risk of complications and morbidity.  The differential diagnosis includes ectopic pregnancy, pregnancy, miscarriage, AUB, PID, injury.   Co morbidities that complicate the patient evaluation  IUD   Additional history obtained:  Additional history obtained from Nursing   External records from outside source obtained and reviewed including triage RN note   Lab Tests:  I Ordered, and personally interpreted labs.  The pertinent results include:   Hgb 11.6    Consultations Obtained:  I requested consultation with GYN Dr. Su Hilt,  and discussed lab and imaging findings as well as pertinent plan - they recommend: outpatient management with [facility] GYN. No further ED intervention required at this time with reassuring workup   Problem List / ED Course:  Vaginal  bleeding Overall, patient is well-appearing.  She is hemodynamically stable with no tachycardia nor hypotension.  Hemoglobin 11.6. Hcg neg Pelvic exam is notable for minor amount of blood in vaginal canal.  Cervix is without friability, lesions.  No CMT.  No discharge, visualized IUD strings. No abdominal pain, fevers I offered to obtain STD panel however patient declined.  Low suspicion for PID with reassuring workup, physical exam, normal vital signs Discussed ED workup, disposition, return to ED precautions with patient who expresses understanding agrees with plan.  All questions answered to their satisfaction.  They are agreeable to plan.  Discharge instructions provided on paperwork   Reevaluation:  After the interventions noted above, I reevaluated the patient and found that they have :stayed the same   Social Determinants of Health:  Has GYN f/u at [facility]:  After consideration of the diagnostic results and the patients response to treatment, I feel that the patent would benefit from outpatient management.    Final Clinical Impression(s) / ED Diagnoses Final diagnoses:  Vaginal bleeding    Rx / DC Orders ED Discharge Orders     None         Judithann Sheen, PA 03/06/24 1806    Rolan Bucco, MD 03/07/24 2352

## 2024-03-06 NOTE — Discharge Instructions (Signed)
 Thank you for letting us evaluate you today.  Your lab work was notable for a mildly decreased blood count of 11.6 with normal being 12 likely secondary to vaginal bleeding.  You have no elevated white count to indicate inflammation and infection.  You are not pregnant.  I was able to visualize IUD strings and believe it is in place.  Please follow-up with your GYN regarding further management.  You may also take over-the-counter iron pills or eat foods high in iron over the next couple days to help with dizziness in setting of vaginal bleeding.  Return to emergency department if you experience worsening symptoms.

## 2024-07-06 DIAGNOSIS — R079 Chest pain, unspecified: Secondary | ICD-10-CM | POA: Diagnosis not present

## 2024-07-08 DIAGNOSIS — I10 Essential (primary) hypertension: Secondary | ICD-10-CM | POA: Diagnosis not present

## 2024-07-08 DIAGNOSIS — K7581 Nonalcoholic steatohepatitis (NASH): Secondary | ICD-10-CM | POA: Diagnosis not present

## 2024-07-08 DIAGNOSIS — E559 Vitamin D deficiency, unspecified: Secondary | ICD-10-CM | POA: Diagnosis not present

## 2024-07-08 DIAGNOSIS — E669 Obesity, unspecified: Secondary | ICD-10-CM | POA: Diagnosis not present

## 2024-07-08 DIAGNOSIS — Z0000 Encounter for general adult medical examination without abnormal findings: Secondary | ICD-10-CM | POA: Diagnosis not present

## 2024-07-22 DIAGNOSIS — K7581 Nonalcoholic steatohepatitis (NASH): Secondary | ICD-10-CM | POA: Diagnosis not present

## 2024-07-22 DIAGNOSIS — R0683 Snoring: Secondary | ICD-10-CM | POA: Diagnosis not present

## 2024-07-22 DIAGNOSIS — R0681 Apnea, not elsewhere classified: Secondary | ICD-10-CM | POA: Diagnosis not present

## 2024-07-22 DIAGNOSIS — G4719 Other hypersomnia: Secondary | ICD-10-CM | POA: Diagnosis not present

## 2024-08-06 DIAGNOSIS — H5213 Myopia, bilateral: Secondary | ICD-10-CM | POA: Diagnosis not present

## 2024-08-13 DIAGNOSIS — G4719 Other hypersomnia: Secondary | ICD-10-CM | POA: Diagnosis not present

## 2024-08-19 DIAGNOSIS — E559 Vitamin D deficiency, unspecified: Secondary | ICD-10-CM | POA: Diagnosis not present

## 2024-08-19 DIAGNOSIS — I959 Hypotension, unspecified: Secondary | ICD-10-CM | POA: Diagnosis not present

## 2024-08-19 DIAGNOSIS — G479 Sleep disorder, unspecified: Secondary | ICD-10-CM | POA: Diagnosis not present

## 2024-08-19 DIAGNOSIS — D500 Iron deficiency anemia secondary to blood loss (chronic): Secondary | ICD-10-CM | POA: Diagnosis not present

## 2024-08-21 ENCOUNTER — Telehealth: Payer: Self-pay

## 2024-08-21 DIAGNOSIS — G4719 Other hypersomnia: Secondary | ICD-10-CM | POA: Diagnosis not present

## 2024-08-21 DIAGNOSIS — R0681 Apnea, not elsewhere classified: Secondary | ICD-10-CM | POA: Diagnosis not present

## 2024-08-21 NOTE — Progress Notes (Unsigned)
 Cardiology Office Note:    Date:  08/22/2024   ID:  Danielle Simmons, DOB [birth year], MRN [id number]  PCP:  Elliot Charm, MD   [facility] Providers Cardiologist:  Vina Gull, MD Cardiology APP:  Madie Jon Garre, PA     Referring MD: Elliot Charm,*   Chief Complaint  Patient presents with   New Patient (Initial Visit)    Hypotension, fluid retention    History of Present Illness:    Danielle Simmons is a 38 y.o. 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   She was referred to cardiology for water retention and hypotension.   She has had a recent intentional weight loss 15 lbs, but is gaining weight overnight from fluid, gained 4 lbs last night. She is following a keto diet, but no processed foods. She has tolerated the keto diet in the past. She states the keto diet has mildly improved her symptoms.   She reports hypotension every night along with dizziness, fatigue, and sleepiness. Lowest BP  83/60 - when its this low she almost falls backwards - describing presyncope but no LOC. This started 1.5 months ago. She stopped BP medication - valsartan-hydrochlorothiazide. She has had 2 bouts of chest pain with low BP, CP radiates to her left arm that typically resolves in 15-20 minutes. She also has heart pounding with climbing stairs, has 4 stories in her home. She reports heart monitor with PCP, I do not have these results.   She lives at home with husband and three kids. Kids are 14, 13, and 10.  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  She is a never smoker, no alcohol, no illicit drugs.  Family history: father had CABG (in [place]), 4 brothers and three sisters. No other heart issues in the family.  She moved here from [place] for college and stayed when she married her husband who is from [place].    Past  Medical History:  Diagnosis Date   Beta thalassemia (HCC)    Bronchitis    Fatty liver    Headache    during pregnancy, Tylenol  helped   Hypertension     Past Surgical History:  Procedure Laterality Date   NO PAST SURGERIES     prolapsed uterus  2018    Current Medications: Current Meds  Medication Sig   ferrous sulfate  325 (65 FE) MG tablet Take 1 tablet (325 mg total) by mouth daily with breakfast.   Vitamin D, Ergocalciferol, 50000 units CAPS Take 1 capsule by mouth once a week.     Allergies:   Bupropion hcl er (xl)   Social History   Socioeconomic History   Marital status: Married    Spouse name: Not on file   Number of children: Not on file   Years of education: Not on file   Highest education level: Not on file  Occupational History   Not on file  Tobacco Use   Smoking status: Never   Smokeless tobacco: Never  Vaping Use   Vaping status: Never Used  Substance and Sexual Activity   Alcohol use: Not Currently   Drug use: Not Currently   Sexual activity: Yes    Birth control/protection: None  Other Topics Concern   Not on file  Social History Narrative   ** Merged History Encounter **       Social Drivers of  Health   Financial Resource Strain: Not on file  Food Insecurity: Not on file  Transportation Needs: Not on file  Physical Activity: Not on file  Stress: Not on file  Social Connections: Not on file     Family History: The patient's family history includes Hypertension in her mother; Thyroid  disease in her sister.  ROS:   Please see the history of present illness.     All other systems reviewed and are negative.  EKGs/Labs/Other Studies Reviewed:    The following studies were reviewed today:  EKG Interpretation Date/Time:  Thursday August 22 2024 15:12:59 EDT Ventricular Rate:  67 PR Interval:  166 QRS Duration:  98 QT Interval:  390 QTC Calculation: 412 R Axis:   67  Text Interpretation: Normal sinus rhythm Normal ECG When  compared with ECG of 01-Jan-2022 12:08, No significant change was found Confirmed by Madie Slough ([id number]) on 08/22/2024 3:25:45 PM    Recent Labs: 03/06/2024: Hemoglobin 11.6; Platelets 160  Recent Lipid Panel No results found for: CHOL, TRIG, HDL, CHOLHDL, VLDL, LDLCALC, LDLDIRECT   Risk Assessment/Calculations:                Physical Exam:    VS:  BP 108/84   Pulse 69   Ht 5' (1.524 m)   Wt 197 lb (89.4 kg)   SpO2 98%   BMI 38.47 kg/m     Wt Readings from Last 3 Encounters:  08/22/24 197 lb (89.4 kg)  03/06/24 195 lb (88.5 kg)  12/03/23 186 lb 4.6 oz (84.5 kg)     GEN:  Well nourished, well developed in no acute distress HEENT: Normal NECK: No JVD; No carotid bruits LYMPHATICS: No lymphadenopathy CARDIAC: RRR, no murmurs, rubs, gallops RESPIRATORY:  Clear to auscultation without rales, wheezing or rhonchi  ABDOMEN: Soft, non-tender, non-distended MUSCULOSKELETAL:  No edema; No deformity  SKIN: Warm and dry NEUROLOGIC:  Alert and oriented x 3 PSYCHIATRIC:  Normal affect   ASSESSMENT:    1. Hypotension, unspecified hypotension type   2. Lower extremity edema   3. Dyspnea on exertion   4. Fatty liver    PLAN:    In order of problems listed above:  Hypotension Dizziness Presyncope - will check echo - recommended additional hydration in the late afternoon - recommended thigh wraps and abdominal binder - orthostatic vitalss negative for hypotension today   Fluid retention - will check BNP - echo as above - unclear etiology, but recent elevated LFTs   Fatty liver disease Elevated LFTs - question if this is due to keto diet and contributing to fluid retention - will check battery of labs including hepatitis panel, LFTs, lipid panel, LPA, GGT, UA   Follow up with Dr. Okey.             Medication Adjustments/Labs and Tests Ordered: Current medicines are reviewed at length with the patient today.  Concerns regarding medicines are  outlined above.  Orders Placed This Encounter  Procedures   Gamma GT   Urinalysis   HgB A1c   Lipid panel   Lipoprotein A (LPA)   Brain natriuretic peptide   HepB+HepC+HIV Panel   Comprehensive Metabolic Panel (CMET)   EKG 12-Lead   ECHOCARDIOGRAM COMPLETE   No orders of the defined types were placed in this encounter.   Patient Instructions  Medication Instructions:  Your physician recommends that you continue on your current medications as directed. Please refer to the Current Medication list given to you today. *If you need  a refill on your cardiac medications before your next appointment, please call your pharmacy*  Lab Work: None ordered If you have labs (blood work) drawn today and your tests are completely normal, you will receive your results only by: MyChart Message (if you have MyChart) OR A paper copy in the mail If you have any lab test that is abnormal or we need to change your treatment, we will call you to review the results.  Testing/Procedures: Your physician has requested that you have an echocardiogram. Echocardiography is a painless test that uses sound waves to create images of your heart. It provides your doctor with information about the size and shape of your heart and how well your heart's chambers and valves are working. This procedure takes approximately one hour. There are no restrictions for this procedure. Please do NOT wear cologne, perfume, aftershave, or lotions (deodorant is allowed). Please arrive 15 minutes prior to your appointment time.  Please note: We ask at that you not bring children with you during ultrasound (echo/ vascular) testing. Due to room size and safety concerns, children are not allowed in the ultrasound rooms during exams. Our front office staff cannot provide observation of children in our lobby area while testing is being conducted. An adult accompanying a patient to their appointment will only be allowed in the ultrasound room  at the discretion of the ultrasound technician under special circumstances. We apologize for any inconvenience.  Follow-Up: At [facility], you and your health needs are our priority.  As part of our continuing mission to provide you with exceptional heart care, our providers are all part of one team.  This team includes your primary Cardiologist (physician) and Advanced Practice Providers or APPs (Physician Assistants and Nurse Practitioners) who all work together to provide you with the care you need, when you need it.  Your next appointment:    First available   Provider:   Vina Gull, MD   We recommend signing up for the patient portal called MyChart.  Sign up information is provided on this After Visit Summary.  MyChart is used to connect with patients for Virtual Visits (Telemedicine).  Patients are able to view lab/test results, encounter notes, upcoming appointments, etc.  Non-urgent messages can be sent to your provider as well.   To learn more about what you can do with MyChart, go to [email or web address].   Other Instructions        Signed, Jon Nat Hails, [place]  08/22/2024 4:50 PM    [facility]

## 2024-08-21 NOTE — Telephone Encounter (Signed)
 ERROR

## 2024-08-22 ENCOUNTER — Ambulatory Visit: Attending: Cardiology | Admitting: Physician Assistant

## 2024-08-22 ENCOUNTER — Encounter: Payer: Self-pay | Admitting: Physician Assistant

## 2024-08-22 VITALS — BP 108/84 | HR 69 | Ht 60.0 in | Wt 197.0 lb

## 2024-08-22 DIAGNOSIS — I959 Hypotension, unspecified: Secondary | ICD-10-CM

## 2024-08-22 DIAGNOSIS — R0609 Other forms of dyspnea: Secondary | ICD-10-CM

## 2024-08-22 DIAGNOSIS — R600 Localized edema: Secondary | ICD-10-CM | POA: Diagnosis not present

## 2024-08-22 DIAGNOSIS — K760 Fatty (change of) liver, not elsewhere classified: Secondary | ICD-10-CM | POA: Diagnosis not present

## 2024-08-22 NOTE — Patient Instructions (Addendum)
 Medication Instructions:  Your physician recommends that you continue on your current medications as directed. Please refer to the Current Medication list given to you today. *If you need a refill on your cardiac medications before your next appointment, please call your pharmacy*  Lab Work: None ordered If you have labs (blood work) drawn today and your tests are completely normal, you will receive your results only by: MyChart Message (if you have MyChart) OR A paper copy in the mail If you have any lab test that is abnormal or we need to change your treatment, we will call you to review the results.  Testing/Procedures: Your physician has requested that you have an echocardiogram. Echocardiography is a painless test that uses sound waves to create images of your heart. It provides your doctor with information about the size and shape of your heart and how well your heart's chambers and valves are working. This procedure takes approximately one hour. There are no restrictions for this procedure. Please do NOT wear cologne, perfume, aftershave, or lotions (deodorant is allowed). Please arrive 15 minutes prior to your appointment time.  Please note: We ask at that you not bring children with you during ultrasound (echo/ vascular) testing. Due to room size and safety concerns, children are not allowed in the ultrasound rooms during exams. Our front office staff cannot provide observation of children in our lobby area while testing is being conducted. An adult accompanying a patient to their appointment will only be allowed in the ultrasound room at the discretion of the ultrasound technician under special circumstances. We apologize for any inconvenience.  Follow-Up: At [facility], you and your health needs are our priority.  As part of our continuing mission to provide you with exceptional heart care, our providers are all part of one team.  This team includes your primary Cardiologist  (physician) and Advanced Practice Providers or APPs (Physician Assistants and Nurse Practitioners) who all work together to provide you with the care you need, when you need it.  Your next appointment:    First available   Provider:   Vina Gull, MD   We recommend signing up for the patient portal called MyChart.  Sign up information is provided on this After Visit Summary.  MyChart is used to connect with patients for Virtual Visits (Telemedicine).  Patients are able to view lab/test results, encounter notes, upcoming appointments, etc.  Non-urgent messages can be sent to your provider as well.   To learn more about what you can do with MyChart, go to [email or web address].   Other Instructions

## 2024-08-23 ENCOUNTER — Ambulatory Visit: Payer: Self-pay | Admitting: Internal Medicine

## 2024-08-23 DIAGNOSIS — K760 Fatty (change of) liver, not elsewhere classified: Secondary | ICD-10-CM

## 2024-08-23 DIAGNOSIS — E611 Iron deficiency: Secondary | ICD-10-CM

## 2024-08-23 LAB — COMPREHENSIVE METABOLIC PANEL WITH GFR
ALT: 105 IU/L — ABNORMAL HIGH (ref 0–32)
AST: 61 IU/L — ABNORMAL HIGH (ref 0–40)
Albumin: 4.6 g/dL (ref 3.9–4.9)
Alkaline Phosphatase: 55 IU/L (ref 44–121)
BUN/Creatinine Ratio: 26 — ABNORMAL HIGH (ref 9–23)
BUN: 18 mg/dL (ref 6–20)
Bilirubin Total: 0.7 mg/dL (ref 0.0–1.2)
CO2: 16 mmol/L — ABNORMAL LOW (ref 20–29)
Calcium: 9.7 mg/dL (ref 8.7–10.2)
Chloride: 101 mmol/L (ref 96–106)
Creatinine, Ser: 0.7 mg/dL (ref 0.57–1.00)
Globulin, Total: 3.6 g/dL (ref 1.5–4.5)
Glucose: 79 mg/dL (ref 70–99)
Potassium: 4.1 mmol/L (ref 3.5–5.2)
Sodium: 137 mmol/L (ref 134–144)
Total Protein: 8.2 g/dL (ref 6.0–8.5)
eGFR: 115 mL/min/1.73 (ref >59)

## 2024-08-23 LAB — HEPB+HEPC+HIV PANEL
HIV Screen 4th Generation wRfx: NONREACTIVE
Hep B C IgM: NEGATIVE
Hep B Core Total Ab: NEGATIVE
Hep B E Ab: NONREACTIVE
Hep B E Ag: NEGATIVE
Hep B Surface Ab, Qual: NONREACTIVE
Hep C Virus Ab: NONREACTIVE
Hepatitis B Surface Ag: NEGATIVE

## 2024-08-23 LAB — URINALYSIS
Bilirubin, UA: NEGATIVE
Glucose, UA: NEGATIVE
Ketones, UA: NEGATIVE
Nitrite, UA: NEGATIVE
Protein,UA: NEGATIVE
RBC, UA: NEGATIVE
Specific Gravity, UA: 1.015 (ref 1.005–1.030)
Urobilinogen, Ur: 0.2 mg/dL (ref 0.2–1.0)
pH, UA: 5.5 (ref 5.0–7.5)

## 2024-08-23 LAB — LIPID PANEL
Chol/HDL Ratio: 5.3 ratio — ABNORMAL HIGH (ref 0.0–4.4)
Cholesterol, Total: 80 mg/dL — ABNORMAL LOW (ref 100–199)
HDL: 15 mg/dL — ABNORMAL LOW (ref >39)
LDL Chol Calc (NIH): 52 mg/dL (ref 0–99)
Triglycerides: 53 mg/dL (ref 0–149)
VLDL Cholesterol Cal: 13 mg/dL (ref 5–40)

## 2024-08-23 LAB — LIPOPROTEIN A (LPA): Lipoprotein (a): 19.7 nmol/L (ref <75.0)

## 2024-08-23 LAB — GAMMA GT: GGT: 19 IU/L (ref 0–60)

## 2024-08-23 LAB — HEMOGLOBIN A1C
Est. average glucose Bld gHb Est-mCnc: 126 mg/dL
Hgb A1c MFr Bld: 6 % — ABNORMAL HIGH (ref 4.8–5.6)

## 2024-08-29 DIAGNOSIS — E611 Iron deficiency: Secondary | ICD-10-CM | POA: Diagnosis not present

## 2024-08-29 DIAGNOSIS — K760 Fatty (change of) liver, not elsewhere classified: Secondary | ICD-10-CM | POA: Diagnosis not present

## 2024-08-30 LAB — HEPATIC FUNCTION PANEL
ALT: 92 IU/L — ABNORMAL HIGH (ref 0–32)
AST: 53 IU/L — ABNORMAL HIGH (ref 0–40)
Albumin: 4.4 g/dL (ref 3.9–4.9)
Alkaline Phosphatase: 73 IU/L (ref 44–121)
Bilirubin Total: 0.5 mg/dL (ref 0.0–1.2)
Bilirubin, Direct: 0.21 mg/dL (ref 0.00–0.40)
Total Protein: 7.8 g/dL (ref 6.0–8.5)

## 2024-08-30 LAB — FERRITIN: Ferritin: 29 ng/mL (ref 15–150)

## 2024-08-30 LAB — SEDIMENTATION RATE: Sed Rate: 19 mm/h (ref 0–32)

## 2024-08-30 LAB — ANA: Anti Nuclear Antibody (ANA): POSITIVE — AB

## 2024-09-09 ENCOUNTER — Encounter: Payer: Self-pay | Admitting: Internal Medicine

## 2024-09-09 ENCOUNTER — Ambulatory Visit: Attending: Internal Medicine | Admitting: Internal Medicine

## 2024-09-09 VITALS — BP 110/80 | HR 87 | Ht 60.0 in | Wt 185.0 lb

## 2024-09-09 DIAGNOSIS — K760 Fatty (change of) liver, not elsewhere classified: Secondary | ICD-10-CM | POA: Diagnosis not present

## 2024-09-09 DIAGNOSIS — R768 Other specified abnormal immunological findings in serum: Secondary | ICD-10-CM | POA: Diagnosis not present

## 2024-09-09 NOTE — Patient Instructions (Signed)
 Medication Instructions:  Your physician recommends that you continue on your current medications as directed. Please refer to the Current Medication list given to you today.  *If you need a refill on your cardiac medications before your next appointment, please call your pharmacy*  Lab Work: In 6 weeks (week of October 27th): LFTs, ANA w/reflex titer.  You may go to any of these [facility] locations: [facility] - [address] ([facility]) - [address] [phone] [address] - [address] (1st floor, next to pharmacy)   [place] - [address]    [place]  - [address]    [place] - [address] - [address] [employer]  - [address] ([facility])  [place]   - [address] If you have labs (blood work) drawn today and your tests are completely normal, you will receive your results only by: [employer] (if you have MyChart) OR A paper copy in the mail If you have any lab test that is abnormal or we need to change your treatment, we will call you to review the results.  Follow-Up: At [facility], you and your health needs are our priority.  As part of our continuing mission to provide you with exceptional heart care, our providers are all part of one team.  This team includes your primary Cardiologist (physician) and Advanced Practice Providers or APPs (Physician Assistants and Nurse Practitioners) who all work together to provide you with the care you need, when you need it.  Your next appointment:   To be determined  Provider:   Vina Gull, MD   We recommend signing up for the patient portal called MyChart.  Sign up information is provided on this After Visit Summary.  MyChart is used to connect with patients for Virtual Visits (Telemedicine).  Patients are able to view lab/test  results, encounter notes, upcoming appointments, etc.  Non-urgent messages can be sent to your provider as well.    To learn more about what you can do with MyChart, go to [email or web address].

## 2024-09-09 NOTE — Progress Notes (Unsigned)
 Cardiology Office Note   Date:  09/09/2024   ID:  Danielle Simmons, DOB Nov 01, 1986, MRN [id number]  PCP:  Elliot Charm, MD  Cardiologist:   Vina Gull, MD   Pt presents for follow up      History of Present Illness: Danielle Simmons is a 38 y.o. female with a history of HTN and hypotension,thalassemia carrior  Pt referred to cardiology for water retention and hypotension.     Symptoms began in June/July 2025  BP was 83/60 at  lowest.  Denies syncope    Pt stopped valsartan/hydrochlorothiazide.   The pt also had 2 episodes of chest pain that radiated to L arm  lasting 15 min    Also noted heart pounding   Pt wore a monitor ordered by PCP Note pt at time had started keto diet with mild improvement in symptoms   Pt seen in clinic  by [facility] in Aug 2025    Echo ordered   Labs drawn. Pt instructed to increase fluid intake, wear compression clothing    Since seen the pt says she is feeling much better    She has eliminated processed foods and is eating whole food, plant based. 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  Denies palpitations   Abs after last visit: UA  Urine a little concentrated  Hbg A1C 6 LDL 52  HDL 15 Total chol 80  Lpa 20 LFTs elevated , improved from previous   GGT normal    ANA positive (no titer)  Ferritin normal   ESR normal  Heptatis panel negative          Current Meds  Medication Sig   ferrous sulfate  325 (65 FE) MG tablet Take 1 tablet (325 mg total) by mouth daily with breakfast.   Vitamin D, Ergocalciferol, 50000 units CAPS Take 1 capsule by mouth once a week.     Allergies:   Bupropion hcl er (xl)   Past Medical History:  Diagnosis Date   Beta thalassemia (HCC)    Bronchitis    Fatty liver    Headache    during pregnancy, Tylenol  helped   Hypertension     Past Surgical History:  Procedure Laterality Date   NO PAST SURGERIES     prolapsed uterus  2018     Social History:  The  patient  reports that she has never smoked. She has never used smokeless tobacco. She reports that she does not currently use alcohol. She reports that she does not currently use drugs.   Family History:  The patient's family history includes Hypertension in her mother; Thyroid  disease in her sister.    ROS:  Please see the history of present illness. All other systems are reviewed and  Negative to the above problem except as noted.    PHYSICAL EXAM: VS:  BP 110/80 (BP Location: Left Arm, Patient Position: Sitting, Cuff Size: Normal)   Pulse 87   Ht 5' (1.524 m)   Wt 185 lb (83.9 kg)   BMI 36.13 kg/m   GEN: Obese 38 yo in NAD  HEENT: normal  Neck: no JVD, carotid bruit Cardiac: RRR; no murmurs,   Respiratory:  clear to auscultation  GI: soft, nontender,  No hepatomegaly  Ext  No LE edema   EKG:  EKG is not  ordered today.   Lipid Panel    Component Value Date/Time   CHOL 80 (L) 08/22/2024 1651  TRIG 53 08/22/2024 1651   HDL 15 (L) 08/22/2024 1651   CHOLHDL 5.3 (H) 08/22/2024 1651   LDLCALC 52 08/22/2024 1651      Wt Readings from Last 3 Encounters:  09/09/24 185 lb (83.9 kg)  08/22/24 197 lb (89.4 kg)  03/06/24 195 lb (88.5 kg)      ASSESSMENT AND PLAN:  1  Hypotension   Pt had problems with dizziness this summer   She was on antihypertensives and starting a keto diet    Dietary changes could have contributed to symptoms    After stopping meds and following a more plant based diet with minimal processed food, she is feeing much better   Good overall.  Very active   I would cancel echo  2 Liver   Pt with abnormal LFTs   She hs been told she has fatty liver changes     Goes along with poor diet in past, A1C of 6 Would repeat LFTs later this winter along with A1C Keep on current diet  3  Lipids   LDL 50  HDL 15    Follow with NMR panel in future   4  Metabolics   A1C  6   Reviewed diet with pt   Keep doing what she is doing now with whole food, plant based    Stay active    Follow up in winter   Will schedule labs later this winter   Follow up in clinic prn    Current medicines are reviewed at length with the patient today.  The patient does not have concerns regarding medicines.  Signed, Vina Gull, MD

## 2024-09-26 ENCOUNTER — Ambulatory Visit: Admitting: Internal Medicine

## 2024-09-27 ENCOUNTER — Ambulatory Visit (HOSPITAL_COMMUNITY)

## 2024-10-04 DIAGNOSIS — R748 Abnormal levels of other serum enzymes: Secondary | ICD-10-CM | POA: Diagnosis not present

## 2024-11-14 DIAGNOSIS — R748 Abnormal levels of other serum enzymes: Secondary | ICD-10-CM | POA: Diagnosis not present

## 2024-11-14 DIAGNOSIS — R634 Abnormal weight loss: Secondary | ICD-10-CM | POA: Diagnosis not present

## 2024-11-14 DIAGNOSIS — K7581 Nonalcoholic steatohepatitis (NASH): Secondary | ICD-10-CM | POA: Diagnosis not present

## 2024-11-14 DIAGNOSIS — D500 Iron deficiency anemia secondary to blood loss (chronic): Secondary | ICD-10-CM | POA: Diagnosis not present

## 2024-11-14 DIAGNOSIS — E559 Vitamin D deficiency, unspecified: Secondary | ICD-10-CM | POA: Diagnosis not present

## 2024-12-23 ENCOUNTER — Ambulatory Visit (INDEPENDENT_AMBULATORY_CARE_PROVIDER_SITE_OTHER)

## 2024-12-23 ENCOUNTER — Encounter: Payer: Self-pay | Admitting: Emergency Medicine

## 2024-12-23 ENCOUNTER — Ambulatory Visit
Admission: EM | Admit: 2024-12-23 | Discharge: 2024-12-23 | Disposition: A | Discharge status: Home / Self Care | Attending: Family Medicine | Admitting: Family Medicine

## 2024-12-23 DIAGNOSIS — E786 Lipoprotein deficiency: Secondary | ICD-10-CM | POA: Insufficient documentation

## 2024-12-23 DIAGNOSIS — K7581 Nonalcoholic steatohepatitis (NASH): Secondary | ICD-10-CM | POA: Insufficient documentation

## 2024-12-23 DIAGNOSIS — G43109 Migraine with aura, not intractable, without status migrainosus: Secondary | ICD-10-CM | POA: Insufficient documentation

## 2024-12-23 DIAGNOSIS — D500 Iron deficiency anemia secondary to blood loss (chronic): Secondary | ICD-10-CM | POA: Insufficient documentation

## 2024-12-23 DIAGNOSIS — R051 Acute cough: Secondary | ICD-10-CM

## 2024-12-23 DIAGNOSIS — E559 Vitamin D deficiency, unspecified: Secondary | ICD-10-CM | POA: Insufficient documentation

## 2024-12-23 DIAGNOSIS — F411 Generalized anxiety disorder: Secondary | ICD-10-CM | POA: Insufficient documentation

## 2024-12-23 DIAGNOSIS — J019 Acute sinusitis, unspecified: Secondary | ICD-10-CM | POA: Diagnosis not present

## 2024-12-23 DIAGNOSIS — J209 Acute bronchitis, unspecified: Secondary | ICD-10-CM | POA: Diagnosis not present

## 2024-12-23 DIAGNOSIS — D563 Thalassemia minor: Secondary | ICD-10-CM | POA: Insufficient documentation

## 2024-12-23 DIAGNOSIS — R748 Abnormal levels of other serum enzymes: Secondary | ICD-10-CM | POA: Insufficient documentation

## 2024-12-23 DIAGNOSIS — I10 Essential (primary) hypertension: Secondary | ICD-10-CM | POA: Insufficient documentation

## 2024-12-23 MED ORDER — ALBUTEROL SULFATE HFA 108 (90 BASE) MCG/ACT IN AERS: 2.0000 | INHALATION_SPRAY | RESPIRATORY_TRACT | 0 refills | Status: AC | PRN

## 2024-12-23 MED ORDER — PREDNISONE 20 MG PO TABS: 40.0000 mg | ORAL_TABLET | Freq: Every day | ORAL | 0 refills | Status: AC

## 2024-12-23 MED ORDER — DOXYCYCLINE HYCLATE 100 MG PO CAPS: 100.0000 mg | ORAL_CAPSULE | Freq: Two times a day (BID) | ORAL | 0 refills | Status: AC

## 2024-12-23 MED ORDER — BENZONATATE 100 MG PO CAPS: 100.0000 mg | ORAL_CAPSULE | Freq: Three times a day (TID) | ORAL | 0 refills | Status: AC | PRN

## 2024-12-23 NOTE — Discharge Instructions (Addendum)
 By my review there is no pneumonia on your x-ray.  The radiologist will also read your x-ray, and if their interpretation differs significantly from mine, and the management of your condition would change, we will call you.  Take doxycycline 100 mg --1 capsule 2 times daily for 7 days; this is the antibiotic for the sinus infection  Take prednisone  20 mg--2 daily for 5 days; this is for inflammation in your lungs  Albuterol  inhaler--do 2 puffs every 4 hours as needed for shortness of breath or wheezing  Take benzonatate  100 mg, 1 tab every 8 hours as needed for cough.

## 2024-12-23 NOTE — ED Triage Notes (Signed)
 Pt presents c/o cough and chest congestion x 14+ days. Pt states,  I had COVID 2 weeks ago and now I'm stuck with this cough and chest congestion that is causing me to have pain when I take deep breaths. And, I've had this headache since yesterday. I need to make sure I don't have pneumonia.  Pt denies emesis, diarrhea, and any additional sxs.

## 2024-12-23 NOTE — ED Provider Notes (Signed)
 " [facility]    CSN: [id number] Arrival date & time: 12/23/24  9160      History   Chief Complaint Chief Complaint  Patient presents with   Cough   Chest Congestion     HPI Danielle Simmons is a 38 y.o. female.    Cough  Here for cough and persistent congestion.  About 2 weeks ago she had COVID.  She has had persistent cough and nasal congestion and postnasal drainage.  In the last 10 days or so she has also had burning when she breathes in and wheezing and feels congested in her chest.  It hurts her when she takes a deep breath.  Also since yesterday she has had a headache.  No fever in the last few days.  No nausea vomiting or diarrhea.  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   Migraine with aura 12/23/2024   NASH (nonalcoholic steatohepatitis) 12/23/2024   Primary hypertension 12/23/2024   Thalassemia carrier 12/23/2024   Vitamin D deficiency 12/23/2024   Dietary iron deficiency without anemia 05/04/2022   Erythrocytosis 01/25/2022   Microcytic red blood cells 01/25/2022   Fatty liver 07/27/2019   Status post normal vaginal delivery 10/15/2014   Postpartum hemorrhage 10/15/2014   Large for gestational age (LGA) 10/14/2014    Past Surgical History:  Procedure Laterality Date   NO PAST SURGERIES     prolapsed uterus  2018    OB History     Gravida  4   Para  3   Term  3   Preterm  0   AB  0   Living  3       SAB  0   IAB  0   Ectopic  0   Multiple      Live Births  3            Home Medications    Prior to Admission medications  Medication Sig Start Date End Date Taking? Authorizing Provider  albuterol  (VENTOLIN  HFA) 108 (90 Base) MCG/ACT inhaler Inhale 2 puffs into the lungs every 4 (four) hours as needed for wheezing or shortness of breath. 12/23/24  Yes Vonna Sharlet POUR, MD  benzonatate  (TESSALON ) 100 MG capsule Take 1 capsule (100 mg total) by mouth 3 (three) times daily as needed for cough. 12/23/24  Yes Tamina Cyphers K, MD  doxycycline (VIBRAMYCIN) 100 MG capsule Take 1 capsule (100 mg total) by mouth 2 (two) times daily for 7 days. 12/23/24 12/30/24 Yes Vonna Sharlet POUR, MD  OVER THE COUNTER MEDICATION Delsym   Yes [provider]  predniSONE  (DELTASONE ) 20 MG tablet Take 2 tablets (40 mg total) by mouth daily with breakfast for 5 days. 12/23/24 12/28/24 Yes Vonna Sharlet POUR, MD  ferrous sulfate  325 (65 FE) MG tablet Take 1 tablet (325 mg total) by mouth daily with breakfast. 05/04/22   Neomi Johnston DASEN, PA-C  Vitamin  D, Ergocalciferol, 50000 units CAPS Take 1 capsule by mouth once a week. 07/11/24   [provider]    Family History Family History  Problem Relation Age of Onset   Hypertension Mother    Thyroid  disease Sister     Social History Social History[1]   Allergies   Bupropion hcl er (xl)   Review of Systems Review of Systems  Respiratory:  Positive for cough.      Physical Exam Triage Vital Signs ED Triage Vitals  Encounter Vitals Group     BP 12/23/24 1012 126/85     Girls Systolic BP Percentile --      Girls Diastolic BP Percentile --      Boys Systolic BP Percentile --      Boys Diastolic BP Percentile --      Pulse Rate 12/23/24 1012 90     Resp 12/23/24 1012 20     Temp 12/23/24 1012 98.2 F (36.8 C)     Temp Source 12/23/24 1012 Oral     SpO2 12/23/24 1012 96 %     Weight 12/23/24 1012 184 lb 15.5 oz (83.9 kg)      Height --      Head Circumference --      Peak Flow --      Pain Score 12/23/24 1010 4     Pain Loc --      Pain Education --      Exclude from Growth Chart --    No data found.  Updated Vital Signs BP 126/85 (BP Location: Left Arm)   Pulse 90   Temp 98.2 F (36.8 C) (Oral)   Resp 20   Wt 83.9 kg   LMP 12/02/2024 (Approximate)   SpO2 96%   Breastfeeding No   BMI 36.12 kg/m   Visual Acuity Right Eye Distance:   Left Eye Distance:   Bilateral Distance:    Right Eye Near:   Left Eye Near:    Bilateral Near:     Physical Exam Vitals reviewed.  Constitutional:      General: She is not in acute distress.    Appearance: She is not ill-appearing, toxic-appearing or diaphoretic.  HENT:     Right Ear: Ear canal normal.     Left Ear: Tympanic membrane and ear canal normal.     Ears:     Comments: No erythema of the right tympanic membrane but it is a little dull.    Nose: Congestion present.     Mouth/Throat:     Mouth: Mucous membranes are moist.     Comments: No erythema.  There is white and clear exudate draining Eyes:     Extraocular Movements: Extraocular movements intact.     Conjunctiva/sclera: Conjunctivae normal.     Pupils: Pupils are equal, round, and reactive to light.  Cardiovascular:     Rate and Rhythm: Normal rate and regular rhythm.     Heart sounds: No murmur heard. Pulmonary:     Effort: No respiratory distress.     Breath sounds: No stridor. No wheezing, rhonchi or rales.  Musculoskeletal:     Cervical back: Neck supple.  Lymphadenopathy:     Cervical: No cervical adenopathy.  Skin:    Capillary Refill: Capillary refill takes less than 2 seconds.     Coloration: Skin is not jaundiced or pale.  Neurological:     General: No focal deficit present.     Mental Status: She is alert and oriented to person,  place, and time.  Psychiatric:        Behavior: Behavior normal.      UC Treatments / Results  Labs (all labs ordered are listed, but  only abnormal results are displayed) Labs Reviewed - No data to display  EKG   Radiology No results found.  Procedures Procedures (including critical care time)  Medications Ordered in UC Medications - No data to display  Initial Impression / Assessment and Plan / UC Course  I have reviewed the triage vital signs and the nursing notes.  Pertinent labs & imaging results that were available during my care of the patient were reviewed by me and considered in my medical decision making (see chart for details).    X-ray by my review does not show any infiltrate and is unchanged from prior x-rays.  She is advised of radiology overread.  Doxycycline is sent in for acute sinusitis, with her having had symptoms for over 7 days and recent worsening.  Albuterol  inhaler and prednisone  are sent in for what could be acute bronchitis.  Tessalon  Perles are sent in for cough. Final Clinical Impressions(s) / UC Diagnoses   Final diagnoses:  Acute cough  Acute sinusitis, recurrence not specified, unspecified location  Acute bronchitis, unspecified organism     Discharge Instructions      By my review there is no pneumonia on your x-ray.  The radiologist will also read your x-ray, and if their interpretation differs significantly from mine, and the management of your condition would change, we will call you.  Take doxycycline 100 mg --1 capsule 2 times daily for 7 days; this is the antibiotic for the sinus infection  Take prednisone  20 mg--2 daily for 5 days; this is for inflammation in your lungs  Albuterol  inhaler--do 2 puffs every 4 hours as needed for shortness of breath or wheezing  Take benzonatate  100 mg, 1 tab every 8 hours as needed for cough.      ED Prescriptions     Medication Sig Dispense Auth. Provider   doxycycline (VIBRAMYCIN) 100 MG capsule Take 1 capsule (100 mg total) by mouth 2 (two) times daily for 7 days. 14 capsule Yong Wahlquist K, MD   predniSONE   (DELTASONE ) 20 MG tablet Take 2 tablets (40 mg total) by mouth daily with breakfast for 5 days. 10 tablet Vonna Sharlet POUR, MD   albuterol  (VENTOLIN  HFA) 108 (90 Base) MCG/ACT inhaler Inhale 2 puffs into the lungs every 4 (four) hours as needed for wheezing or shortness of breath. 1 each Vonna Sharlet POUR, MD   benzonatate  (TESSALON ) 100 MG capsule Take 1 capsule (100 mg total) by mouth 3 (three) times daily as needed for cough. 21 capsule Standley Bargo K, MD      PDMP not reviewed this encounter.    [1]  Social History Tobacco Use   Smoking status: Never    Passive exposure: Never   Smokeless tobacco: Never  Vaping Use   Vaping status: Never Used  Substance Use Topics   Alcohol use: Not Currently   Drug use: Not Currently     Vonna Sharlet POUR, MD 12/23/24 1130  "
